# Patient Record
Sex: Female | Born: 1990 | Race: White | Hispanic: No | Marital: Single | Smoking: Never smoker
Health system: Southern US, Community
[De-identification: ages and names within clinical notes are randomized; demographics above are authoritative.]

## PROBLEM LIST (undated history)

## (undated) DIAGNOSIS — R1319 Other dysphagia: Secondary | ICD-10-CM

## (undated) DIAGNOSIS — K222 Esophageal obstruction: Secondary | ICD-10-CM

## (undated) DIAGNOSIS — K219 Gastro-esophageal reflux disease without esophagitis: Secondary | ICD-10-CM

## (undated) DIAGNOSIS — D802 Selective deficiency of immunoglobulin A [IgA]: Secondary | ICD-10-CM

## (undated) DIAGNOSIS — G43909 Migraine, unspecified, not intractable, without status migrainosus: Secondary | ICD-10-CM

## (undated) DIAGNOSIS — K589 Irritable bowel syndrome without diarrhea: Secondary | ICD-10-CM

## (undated) DIAGNOSIS — J329 Chronic sinusitis, unspecified: Secondary | ICD-10-CM

## (undated) DIAGNOSIS — T753XXA Motion sickness, initial encounter: Secondary | ICD-10-CM

## (undated) DIAGNOSIS — J45909 Unspecified asthma, uncomplicated: Secondary | ICD-10-CM

## (undated) DIAGNOSIS — K529 Noninfective gastroenteritis and colitis, unspecified: Secondary | ICD-10-CM

## (undated) DIAGNOSIS — T7840XA Allergy, unspecified, initial encounter: Secondary | ICD-10-CM

## (undated) DIAGNOSIS — R51 Headache: Secondary | ICD-10-CM

## (undated) DIAGNOSIS — R1310 Dysphagia, unspecified: Secondary | ICD-10-CM

## (undated) DIAGNOSIS — R519 Headache, unspecified: Secondary | ICD-10-CM

## (undated) HISTORY — DX: Noninfective gastroenteritis and colitis, unspecified: K52.9

## (undated) HISTORY — PX: OTHER SURGICAL HISTORY: SHX169

## (undated) HISTORY — DX: Gastro-esophageal reflux disease without esophagitis: K21.9

## (undated) HISTORY — DX: Other dysphagia: R13.19

## (undated) HISTORY — DX: Migraine, unspecified, not intractable, without status migrainosus: G43.909

## (undated) HISTORY — PX: OVARIAN CYST REMOVAL: SHX89

## (undated) HISTORY — DX: Esophageal obstruction: K22.2

## (undated) HISTORY — DX: Allergy, unspecified, initial encounter: T78.40XA

---
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
## 2014-03-05 ENCOUNTER — Ambulatory Visit: Payer: Self-pay

## 2014-03-05 LAB — RAPID STREP-A WITH REFLX: Micro Text Report: NEGATIVE

## 2014-03-08 ENCOUNTER — Emergency Department: Payer: Self-pay | Admitting: Emergency Medicine

## 2014-03-08 DIAGNOSIS — K529 Noninfective gastroenteritis and colitis, unspecified: Secondary | ICD-10-CM | POA: Insufficient documentation

## 2014-03-08 LAB — COMPREHENSIVE METABOLIC PANEL
ALBUMIN: 3.5 g/dL (ref 3.4–5.0)
ALK PHOS: 98 U/L
ANION GAP: 11 (ref 7–16)
BILIRUBIN TOTAL: 0.4 mg/dL (ref 0.2–1.0)
BUN: 10 mg/dL (ref 7–18)
CALCIUM: 9.1 mg/dL (ref 8.5–10.1)
CHLORIDE: 103 mmol/L (ref 98–107)
Co2: 23 mmol/L (ref 21–32)
Creatinine: 0.91 mg/dL (ref 0.60–1.30)
Glucose: 82 mg/dL (ref 65–99)
Osmolality: 272 (ref 275–301)
POTASSIUM: 3.8 mmol/L (ref 3.5–5.1)
SGOT(AST): 37 U/L (ref 15–37)
SGPT (ALT): 43 U/L
SODIUM: 137 mmol/L (ref 136–145)
Total Protein: 8.1 g/dL (ref 6.4–8.2)

## 2014-03-08 LAB — URINALYSIS, COMPLETE
BILIRUBIN, UR: NEGATIVE
Bacteria: NONE SEEN
Blood: NEGATIVE
Glucose,UR: NEGATIVE mg/dL (ref 0–75)
Leukocyte Esterase: NEGATIVE
NITRITE: NEGATIVE
Ph: 5 (ref 4.5–8.0)
RBC,UR: <1 /HPF (ref 0–5)
Specific Gravity: 1.024 (ref 1.003–1.030)
Squamous Epithelial: 1

## 2014-03-08 LAB — CBC WITH DIFFERENTIAL/PLATELET
Basophil #: 0 10*3/uL (ref 0.0–0.1)
Basophil %: 0.6 %
EOS ABS: 0.1 10*3/uL (ref 0.0–0.7)
EOS PCT: 0.7 %
HCT: 42.7 % (ref 35.0–47.0)
HGB: 14.2 g/dL (ref 12.0–16.0)
Lymphocyte #: 1.5 10*3/uL (ref 1.0–3.6)
Lymphocyte %: 20.1 %
MCH: 27.5 pg (ref 26.0–34.0)
MCHC: 33.2 g/dL (ref 32.0–36.0)
MCV: 83 fL (ref 80–100)
Monocyte #: 0.6 x10 3/mm (ref 0.2–0.9)
Monocyte %: 8.1 %
NEUTROS ABS: 5.3 10*3/uL (ref 1.4–6.5)
NEUTROS PCT: 70.5 %
Platelet: 279 10*3/uL (ref 150–440)
RBC: 5.15 10*6/uL (ref 3.80–5.20)
RDW: 12.6 % (ref 11.5–14.5)
WBC: 7.5 10*3/uL (ref 3.6–11.0)

## 2014-03-08 LAB — HCG, QUANTITATIVE, PREGNANCY: Beta Hcg, Quant.: <1 m[IU]/mL — ABNORMAL LOW

## 2014-03-08 LAB — BETA STREP CULTURE(ARMC)

## 2014-03-08 LAB — LIPASE, BLOOD: Lipase: 111 U/L (ref 73–393)

## 2014-03-24 ENCOUNTER — Ambulatory Visit: Payer: Self-pay | Admitting: Gastroenterology

## 2014-06-27 ENCOUNTER — Ambulatory Visit
Admit: 2014-06-27 | Disposition: A | Discharge status: Home / Self Care | Payer: Self-pay | Attending: Family Medicine | Admitting: Family Medicine

## 2014-06-28 ENCOUNTER — Ambulatory Visit: Admit: 2014-06-28 | Disposition: A | Discharge status: Home / Self Care | Payer: Self-pay | Admitting: Urgent Care

## 2014-06-28 LAB — COMPREHENSIVE METABOLIC PANEL
ALT: 14 U/L
AST: 20 U/L
Albumin: 3.9 g/dL
Alkaline Phosphatase: 69 U/L
Anion Gap: 8 (ref 7–16)
BUN: 11 mg/dL
Bilirubin,Total: 0.2 mg/dL — ABNORMAL LOW
CREATININE: 1 mg/dL
Calcium, Total: 9 mg/dL
Chloride: 104 mmol/L
Co2: 26 mmol/L
EGFR (Non-African Amer.): >60
GLUCOSE: 92 mg/dL
Potassium: 3.6 mmol/L
Sodium: 138 mmol/L
TOTAL PROTEIN: 7.6 g/dL

## 2014-06-28 LAB — CBC WITH DIFFERENTIAL/PLATELET
Basophil #: 0 10*3/uL (ref 0.0–0.1)
Basophil %: 0.4 %
EOS ABS: 0.1 10*3/uL (ref 0.0–0.7)
Eosinophil %: 1.3 %
HCT: 39.8 % (ref 35.0–47.0)
HGB: 13.2 g/dL (ref 12.0–16.0)
LYMPHS PCT: 17.2 %
Lymphocyte #: 1.6 10*3/uL (ref 1.0–3.6)
MCH: 27 pg (ref 26.0–34.0)
MCHC: 33.1 g/dL (ref 32.0–36.0)
MCV: 82 fL (ref 80–100)
MONO ABS: 0.6 x10 3/mm (ref 0.2–0.9)
Monocyte %: 6.7 %
NEUTROS ABS: 7.1 10*3/uL — AB (ref 1.4–6.5)
Neutrophil %: 74.4 %
Platelet: 252 10*3/uL (ref 150–440)
RBC: 4.88 10*6/uL (ref 3.80–5.20)
RDW: 13.1 % (ref 11.5–14.5)
WBC: 9.5 10*3/uL (ref 3.6–11.0)

## 2014-06-28 LAB — HCG, QUANTITATIVE, PREGNANCY: Beta Hcg, Quant.: <1 m[IU]/mL

## 2014-06-28 LAB — LIPASE, BLOOD: LIPASE: 28 U/L

## 2014-06-30 ENCOUNTER — Ambulatory Visit
Admit: 2014-06-30 | Disposition: A | Discharge status: Home / Self Care | Payer: Self-pay | Attending: Urgent Care | Admitting: Urgent Care

## 2014-08-10 ENCOUNTER — Ambulatory Visit
Admission: EM | Admit: 2014-08-10 | Discharge: 2014-08-10 | Disposition: A | Discharge status: Home / Self Care | Payer: 59 | Attending: Family Medicine | Admitting: Family Medicine

## 2014-08-10 DIAGNOSIS — H109 Unspecified conjunctivitis: Secondary | ICD-10-CM | POA: Diagnosis not present

## 2014-08-10 DIAGNOSIS — R05 Cough: Secondary | ICD-10-CM

## 2014-08-10 DIAGNOSIS — R0982 Postnasal drip: Secondary | ICD-10-CM

## 2014-08-10 DIAGNOSIS — R059 Cough, unspecified: Secondary | ICD-10-CM

## 2014-08-10 HISTORY — DX: Unspecified asthma, uncomplicated: J45.909

## 2014-08-10 HISTORY — DX: Irritable bowel syndrome, unspecified: K58.9

## 2014-08-10 HISTORY — DX: Chronic sinusitis, unspecified: J32.9

## 2014-08-10 HISTORY — DX: Selective deficiency of immunoglobulin a (iga): D80.2

## 2014-08-10 MED ORDER — ALBUTEROL SULFATE HFA 108 (90 BASE) MCG/ACT IN AERS: 1.0000 | INHALATION_SPRAY | Freq: Four times a day (QID) | RESPIRATORY_TRACT | Status: DC | PRN

## 2014-08-10 MED ORDER — BENZONATATE 100 MG PO CAPS: 100.0000 mg | ORAL_CAPSULE | Freq: Three times a day (TID) | ORAL | Status: DC | PRN

## 2014-08-10 MED ORDER — FLUTICASONE PROPIONATE 50 MCG/ACT NA SUSP: 2.0000 | Freq: Every day | NASAL | Status: DC

## 2014-08-10 MED ORDER — IPRATROPIUM-ALBUTEROL 0.5-2.5 (3) MG/3ML IN SOLN
3.0000 mL | Freq: Once | RESPIRATORY_TRACT | Status: AC
Start: 2014-08-10 — End: 2014-08-10
  Administered 2014-08-10: 3 mL via RESPIRATORY_TRACT

## 2014-08-10 MED ORDER — POLYMYXIN B-TRIMETHOPRIM 10000-0.1 UNIT/ML-% OP SOLN: 1.0000 [drp] | OPHTHALMIC | Status: DC

## 2014-08-10 NOTE — ED Notes (Signed)
Has had cough and nasal congestion x 2 weeks. Left eye itching yesterday and crusted this morning

## 2014-08-10 NOTE — ED Provider Notes (Signed)
CSN: [id number]     Arrival date & time 08/10/14  1509 History   First MD Initiated Contact with Patient 08/10/14 1555     Chief Complaint  Patient presents with  . Facial Pain  . Cough  . Conjunctivitis   (Consider location/radiation/quality/duration/timing/severity/associated sxs/prior Treatment) HPI  24 yo F with hx of seasonal allergies and occassional sinusitis. Has been symptommatic for a few weeks. Uses [facility]. 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 Has had post nasal drip and cough, with throat irritation. Mild temp at 99 or 99.5  Past Medical History  Diagnosis Date  . IBS (irritable bowel syndrome)   . Asthma   . Sinusitis   . IgA deficiency, selective    Past Surgical History  Procedure Laterality Date  . Ovarian cyst removal Right     2007   Family History  Problem Relation Age of Onset  . Cancer Father    History  Substance Use Topics  . Smoking status: Never Smoker   . Smokeless tobacco: Not on file  . Alcohol Use: No   OB History    No data available     Review of Systems  HENT: Positive for congestion, postnasal drip and sinus pressure.   Eyes: Positive for discharge, redness and itching. Negative for photophobia, pain and visual disturbance.  Respiratory: Positive for cough.   Cardiovascular: Negative.   Gastrointestinal: Negative.   Endocrine: Negative.   Genitourinary: Negative.   Musculoskeletal: Negative.   Skin: Negative.   Allergic/Immunologic: Positive for environmental allergies.  Neurological: Negative.   Hematological: Negative.   Psychiatric/Behavioral: Negative.   All other systems reviewed and are negative. Has IBS and treats as needed  Allergies  Biaxin and Cefzil  Home Medications   [name]   Medication Sig Start Date End Date Taking? Authorizing Provider  dexlansoprazole ([name]) 60 MG capsule Take 60 mg by mouth  daily.   Yes Historical Provider, MD  [name] Take 10 mg by mouth 4 (four) times daily -  before meals and at bedtime.   Yes Historical Provider, MD  [name] Take 180 mg by mouth daily.   Yes Historical Provider, MD  lubiprostone (AMITIZA) 8 MCG capsule Take 8 mcg by mouth 2 (two) times daily with a meal.   Yes Historical Provider, MD  norethindrone-ethinyl estradiol-iron (MICROGESTIN FE,GILDESS FE,LOESTRIN FE) 1.5-30 MG-MCG tablet Take 1 tablet by mouth daily.   Yes Historical Provider, MD  ondansetron (ZOFRAN) 4 MG tablet Take 4 mg by mouth every 8 (eight) hours as needed for nausea or vomiting.   Yes Historical Provider, MD  [name] Take 25 mg by mouth every 6 (six) hours as needed for nausea or vomiting.   Yes Historical Provider, MD  [name];[name] Inhale 1-2 puffs into the lungs every 6 (six) hours as needed for wheezing or shortness of breath. Or cough 08/10/14   Rae Halsted, PA-C  benzonatate (TESSALON) 100 MG capsule Take 1 capsule (100 mg total) by mouth 3 (three) times daily as needed for cough. 08/10/14   Rae Halsted, PA-C  fluticasone [facility]) 50 MCG/ACT nasal spray Place 2 sprays into both nostrils daily. 08/10/14   Rae Halsted, PA-C  [name] Take 5 mg by mouth 4 (four) times daily.    Historical Provider, MD  trimethoprim-polymyxin b (POLYTRIM) ophthalmic solution Place 1 drop into the left eye every 4 (four) hours. 08/10/14   Rae HalstedLaurie W Topanga Alvelo, PA-C   BP 118/68 mmHg  Pulse 100  Temp(Src) 99 F (37.2 C) (Tympanic)  Resp 16  Ht 5\' 5"  (1.651 m)  Wt 138 lb (62.596 kg)  BMI 22.96 kg/m2  SpO2 100%  LMP 08/09/2014 (Exact Date) Physical Exam Constitutional -alert and oriented,well appearing and in mild distress w cough and concern about her left eye Head-atraumatic Eyes- R conjunctiva normal, L mildly injected, increased  tearing, itchy irritation,no visual changes EOMI ,conjugate gaze Ears-canals clear bilat, TMs WNL Nose-mild congestion,has some sinus pressure Mouth/throat- mucous membranes moist ,oropharynx non-erythematous Neck- supple without glandular enlargement CV- regular rate, grossly normal heart sounds, Resp- normal respiratory effort,clear to auscultation bilaterally,episodic non-productive cough History of childhood asthma GI-,no distention GU- not examined MSK-  ambulatory Neuro- normal speech and language, Skin-warm,dry ,intact; no rash noted Psych- speech and behavior grossly normal  ED Course  Procedures (including critical care time) Labs Review Labs Reviewed - No data to display  Imaging Review  No results found.  Medications  ipratropium-albuterol (DUONEB) 0.5-2.5 (3) MG/3ML nebulizer solution 3 mL (3 mLs Nebulization Given 08/10/14 1618)   Patient had relief of cough post Neb Rx and was very pleased- has used inhaler in the past with childhood asthma-OK for prn as she is uncomfortable with a cough on duty.  MDM   1. Cough   2. Post-nasal drip   3. Conjunctivitis of left eye     Plan: 1.  Diagnosis reviewed with patient-seasonal allergies suspected- hx asthma 2. Rx Benzonatate, fluticasone ; risks, benefits, potential side effects reviewed with patient Add albuterol inhaler - patient has used in the past and is familiar. 3. Recommend supportive treatment with OTC ceterizine/guafenesin/tylenol/ibuprofen/fluids 4. Add Polytrim for left eye symptoms-careful handwashing 5. F/u prn if symptoms worsen or don't improve; viral syndrome can be similar- she does not tolerate decongestants and defers 6. She has sick time - OK for 2 days OOW- 7. Strongly encouraged to establish PCP care- given local business cards 8.Questions fielded , recommendations and expectations reviewed- patient expresses understanding   Rae HalstedLaurie W Gyan Cambre, PA-C 08/11/14 1707

## 2014-08-11 ENCOUNTER — Encounter: Payer: Self-pay | Admitting: Physician Assistant

## 2014-10-11 ENCOUNTER — Ambulatory Visit: Payer: Self-pay | Admitting: Urgent Care

## 2014-10-12 ENCOUNTER — Ambulatory Visit: Payer: Self-pay | Admitting: Gastroenterology

## 2014-10-13 ENCOUNTER — Encounter: Payer: Self-pay | Admitting: Obstetrics and Gynecology

## 2014-10-13 ENCOUNTER — Ambulatory Visit (INDEPENDENT_AMBULATORY_CARE_PROVIDER_SITE_OTHER): Payer: 59 | Admitting: Obstetrics and Gynecology

## 2014-10-13 VITALS — BP 98/68 | HR 87 | Ht 65.0 in | Wt 140.5 lb

## 2014-10-13 DIAGNOSIS — N921 Excessive and frequent menstruation with irregular cycle: Secondary | ICD-10-CM

## 2014-10-13 DIAGNOSIS — Z8742 Personal history of other diseases of the female genital tract: Secondary | ICD-10-CM

## 2014-10-13 DIAGNOSIS — Z01419 Encounter for gynecological examination (general) (routine) without abnormal findings: Secondary | ICD-10-CM

## 2014-10-13 DIAGNOSIS — Z23 Encounter for immunization: Secondary | ICD-10-CM

## 2014-10-13 MED ORDER — JUNEL FE 1/20 1-20 MG-MCG PO TABS: 1.0000 | ORAL_TABLET | Freq: Every day | ORAL | Status: DC

## 2014-10-13 NOTE — Patient Instructions (Addendum)
Place annual gynecologic exam patient instructions here.  Thank you for enrolling in [facility]. Please follow the instructions below to securely access your online medical record. MyChart allows you to send messages to your doctor, view your test results, manage appointments, and more.   How Do I Sign Up? 1. In your Internet browser, go to [employer] and enter [email or web address]. 2. Click on the Sign Up Now link in the Sign In box. You will see the New Member Sign Up page. 3. Enter your MyChart Access Code exactly as it appears below. You will not need to use this code after you've completed the sign-up process. If you do not sign up before the expiration date, you must request a new code.  MyChart Access Code: [email or web address] Expires: 12/12/2014 10:17 AM  4. Enter your Social Security Number ([email or web address]) and Date of Birth (mm/dd/yyyy) as indicated and click Submit. You will be taken to the next sign-up page. 5. Create a MyChart ID. This will be your MyChart login ID and cannot be changed, so think of one that is secure and easy to remember. 6. Create a MyChart password. You can change your password at any time. 7. Enter your Password Reset Question and Answer. This can be used at a later time if you forget your password.  8. Enter your e-mail address. You will receive e-mail notification when new information is available in [facility]. 9. Click Sign Up. You can now view your medical record.   Additional Information Remember, MyChart is NOT to be used for urgent needs. For medical emergencies, dial 911.   .Human Papillomavirus Quadrivalent Vaccine suspension for injection What is this medicine? HUMAN PAPILLOMAVIRUS VACCINE (HYOO muhn pap uh LOH muh vahy ruhs vak SEEN) is a vaccine. It is used to prevent infections of four types of the human papillomavirus. In women, the vaccine may lower your risk of getting cervical, vaginal, vulvar, or anal cancer and genital warts. In men,  the vaccine may lower your risk of getting genital warts and anal cancer. You cannot get these diseases from the vaccine. This vaccine does not treat these diseases. This medicine may be used for other purposes; ask your health care provider or pharmacist if you have questions. COMMON BRAND NAME(S): Gardasil What should I tell my health care provider before I take this medicine? They need to know if you have any of these conditions: -fever or infection -hemophilia -HIV infection or AIDS -immune system problems -low platelet count -an unusual reaction to Human Papillomavirus Vaccine, yeast, other medicines, foods, dyes, or preservatives -pregnant or trying to get pregnant -breast-feeding How should I use this medicine? This vaccine is for injection in a muscle on your upper arm or thigh. It is given by a health care professional. Dennis Bast will be observed for 15 minutes after each dose. Sometimes, fainting happens after the vaccine is given. You may be asked to sit or lie down during the 15 minutes. Three doses are given. The second dose is given 2 months after the first dose. The last dose is given 4 months after the second dose. A copy of a Vaccine Information Statement will be given before each vaccination. Read this sheet carefully each time. The sheet may change frequently. Talk to your pediatrician regarding the use of this medicine in children. While this drug may be prescribed for children as young as 10 years of age for selected conditions, precautions do apply. Overdosage: If you think you have taken too much of this medicine contact  a poison control center or emergency room at once. NOTE: This medicine is only for you. Do not share this medicine with others. What if I miss a dose? All 3 doses of the vaccine should be given within 6 months. Remember to keep appointments for follow-up doses. Your health care provider will tell you when to return for the next vaccine. Ask your health care  professional for advice if you are unable to keep an appointment or miss a scheduled dose. What may interact with this medicine? -other vaccines This list may not describe all possible interactions. Give your health care provider a list of all the medicines, herbs, non-prescription drugs, or dietary supplements you use. Also tell them if you smoke, drink alcohol, or use illegal drugs. Some items may interact with your medicine. What should I watch for while using this medicine? This vaccine may not fully protect everyone. Continue to have regular pelvic exams and cervical or anal cancer screenings as directed by your doctor. The Human Papillomavirus is a sexually transmitted disease. It can be passed by any kind of sexual activity that involves genital contact. The vaccine works best when given before you have any contact with the virus. Many people who have the virus do not have any signs or symptoms. Tell your doctor or health care professional if you have any reaction or unusual symptom after getting the vaccine. What side effects may I notice from receiving this medicine? Side effects that you should report to your doctor or health care professional as soon as possible: -allergic reactions like skin rash, itching or hives, swelling of the face, lips, or tongue -breathing problems -feeling faint or lightheaded, falls Side effects that usually do not require medical attention (report to your doctor or health care professional if they continue or are bothersome): -cough -dizziness -fever -headache -nausea -redness, warmth, swelling, pain, or itching at site where injected This list may not describe all possible side effects. Call your doctor for medical advice about side effects. You may report side effects to FDA at 1-800-FDA-1088. Where should I keep my medicine? This drug is given in a hospital or clinic and will not be stored at home. NOTE: This sheet is a summary. It may not cover all  possible information. If you have questions about this medicine, talk to your doctor, pharmacist, or health care provider.  2015, Elsevier/Gold Standard. (2013-04-26 13:14:33)

## 2014-10-13 NOTE — Progress Notes (Signed)
  Subjective:     Carol Conley is a 24 y.o. female and is here for a comprehensive physical exam. The patient reports problems - BTB on generic OCPs as the pharmacy keeps changing brand she is taking.  History   Social History  . [name]    [name]  . [name]  . [name]   Occupational History  . Not on file.   Social History Main Topics  . Smoking status: Never Smoker   . Smokeless tobacco: Not on file  . Alcohol Use: No  . [name]  . [name]   Other Topics Concern  . Not on file   Social History Narrative   Health Maintenance  Topic Date Due  . HIV Screening  04/16/2003  . PAP SMEAR  04/15/2006  . TETANUS/TDAP  04/16/2007  . INFLUENZA VACCINE  10/17/2014    The following portions of the patient's history were reviewed and updated as appropriate: allergies, current medications, past family history, past medical history, past social history, past surgical history and problem list.  Review of Systems A comprehensive review of systems was negative except for: Gastrointestinal: positive for abdominal pain, diarrhea and flares of IBS- not new Genitourinary: positive for frequency and BTB on curent OCPs (which she takes continuously)   Objective:    General appearance: alert, cooperative, appears stated age and no distress Neck: no adenopathy, no carotid bruit, no JVD, supple, symmetrical, trachea midline and thyroid not enlarged, symmetric, no tenderness/mass/nodules Lungs: clear to auscultation bilaterally Breasts: normal appearance, no masses or tenderness Heart: regular rate and rhythm, S1, S2 normal, no murmur, click, rub or gallop Abdomen: soft, non-tender; bowel sounds normal; no masses,  no organomegaly Pelvic: not indicated    Assessment:    Healthy female exam. BTB on current OCPs; IBS; start gardasil series      Plan:  Rx given for [facility] FE only- taking continuously 1st  gardasil given, RTC at 2 & 6 months RTC 1 year or prn   See After Visit Summary for Counseling Recommendations  Isabelle Matt Ines Bloomer, CNM

## 2014-10-20 ENCOUNTER — Encounter: Payer: Self-pay | Admitting: Gastroenterology

## 2014-10-20 ENCOUNTER — Other Ambulatory Visit: Payer: Self-pay

## 2014-10-20 ENCOUNTER — Ambulatory Visit (INDEPENDENT_AMBULATORY_CARE_PROVIDER_SITE_OTHER): Payer: 59 | Admitting: Gastroenterology

## 2014-10-20 VITALS — BP 110/65 | HR 96 | Temp 98.7°F | Ht 65.0 in | Wt 140.0 lb

## 2014-10-20 DIAGNOSIS — K649 Unspecified hemorrhoids: Secondary | ICD-10-CM | POA: Insufficient documentation

## 2014-10-20 DIAGNOSIS — R110 Nausea: Secondary | ICD-10-CM | POA: Diagnosis not present

## 2014-10-20 DIAGNOSIS — K589 Irritable bowel syndrome without diarrhea: Secondary | ICD-10-CM | POA: Insufficient documentation

## 2014-10-20 DIAGNOSIS — K648 Other hemorrhoids: Secondary | ICD-10-CM | POA: Insufficient documentation

## 2014-10-20 DIAGNOSIS — K219 Gastro-esophageal reflux disease without esophagitis: Secondary | ICD-10-CM | POA: Insufficient documentation

## 2014-10-20 DIAGNOSIS — D802 Selective deficiency of immunoglobulin A [IgA]: Secondary | ICD-10-CM | POA: Insufficient documentation

## 2014-10-20 DIAGNOSIS — Z8719 Personal history of other diseases of the digestive system: Secondary | ICD-10-CM | POA: Insufficient documentation

## 2014-10-20 DIAGNOSIS — J45909 Unspecified asthma, uncomplicated: Secondary | ICD-10-CM | POA: Insufficient documentation

## 2014-10-20 DIAGNOSIS — R1013 Epigastric pain: Secondary | ICD-10-CM | POA: Insufficient documentation

## 2014-10-20 DIAGNOSIS — R109 Unspecified abdominal pain: Secondary | ICD-10-CM | POA: Insufficient documentation

## 2014-10-20 MED ORDER — ONDANSETRON 4 MG PO TBDP
4.0000 mg | ORAL_TABLET | Freq: Three times a day (TID) | ORAL | Status: DC | PRN
Start: 2014-10-20 — End: 2015-11-24

## 2014-10-20 MED ORDER — DESIPRAMINE HCL 25 MG PO TABS: 25.0000 mg | ORAL_TABLET | Freq: Every day | ORAL | Status: DC

## 2014-10-20 NOTE — Progress Notes (Signed)
Primary Care Physician: No PCP Per Patient  Primary Gastroenterologist:  Dr. [name]  Chief Complaint  Patient presents with  . Irritable Bowel Syndrome    HPI: Carol Conley is a 24 y.o. female here for follow-up of irritable bowel syndrome. The patient has chronic nausea with her irritable bowel syndrome and also has alternating diarrhea and constipation. The patient states that she has been in her usual state of health but had ran out of the medication. The patient had tried probiotics but states that they made her worse.  Current Outpatient Prescriptions  Medication Sig Dispense Refill  . [name];[name] Inhale 1-2 puffs into the lungs every 6 (six) hours as needed for wheezing or shortness of breath. Or cough 1 Inhaler 0  . aspirin-acetaminophen-caffeine (EXCEDRIN MIGRAINE) 250-250-65 MG per tablet Take by mouth.    . dexlansoprazole ([name]) 60 MG capsule Take 60 mg by mouth daily.    Marland Kitchen [name] Take 10 mg by mouth 4 (four) times daily -  before meals and at bedtime.    . [name] Take 180 mg by mouth daily.    . fluticasone (FLONASE) 50 MCG/ACT nasal spray Place 2 sprays into both nostrils daily. 16 g 2  . [name] 1-20 MG-MCG tablet Take 1 tablet by mouth daily. 4 Package 4  . [name] Take 5 mg by mouth 4 (four) times daily.    . ondansetron (ZOFRAN) 4 MG tablet Take 4 mg by mouth every 8 (eight) hours as needed for nausea or vomiting.    . [name] Take 25 mg by mouth every 6 (six) hours as needed for nausea or vomiting.    . benzonatate (TESSALON) 100 MG capsule Take 1 capsule (100 mg total) by mouth 3 (three) times daily as needed for cough. (Patient not taking: Reported on 10/13/2014) 30 capsule 1  . desipramine (NORPRAMIN) 25 MG tablet Take 1 tablet (25 mg total) by mouth at bedtime. 30 tablet 3  .  docusate sodium (COLACE) 100 MG capsule Take by mouth.    . [name] Take 1 tablet (4 mg total) by mouth every 8 (eight) hours as needed for nausea or vomiting. 30 tablet 5  . sucralfate (CARAFATE) 1 G tablet Take by mouth.     No current facility-administered medications for this visit.    Allergies as of 10/20/2014 - Review Complete 10/20/2014  Allergen Reaction Noted  . [name] Hives 08/10/2014  . [name] Hives 08/10/2014    ROS:  General: Negative for anorexia, weight loss, fever, chills, fatigue, weakness. ENT: Negative for hoarseness, difficulty swallowing , nasal congestion. CV: Negative for chest pain, angina, palpitations, dyspnea on exertion, peripheral edema.  Respiratory: Negative for dyspnea at rest, dyspnea on exertion, cough, sputum, wheezing.  GI: See history of present illness. GU:  Negative for dysuria, hematuria, urinary incontinence, urinary frequency, nocturnal urination.  Endo: Negative for unusual weight change.    Physical Examination:   BP 110/65 mmHg  Pulse 96  Temp(Src) 98.7 F (37.1 C)  Ht  (1.651 m)  Wt 140 lb (63.504 kg)  BMI 23.30 kg/m2  General: Well-nourished, well-developed in no acute distress.  Eyes: No icterus. Conjunctivae pink. Mouth: Oropharyngeal mucosa moist and pink , no lesions erythema or exudate. Lungs: Clear to auscultation bilaterally. Non-labored. Heart: Regular rate and rhythm, no murmurs  rubs or gallops.  Abdomen: Bowel sounds are normal, nontender, nondistended, no hepatosplenomegaly or masses, no abdominal bruits or hernia , no rebound or guarding.   Extremities: No lower extremity edema. No clubbing or deformities. Neuro: Alert and oriented x 3.  Grossly intact. Skin: Warm and dry, no jaundice.   Psych: Alert and cooperative, normal mood and affect.  Labs:    Imaging Studies: No results found.  Assessment and Plan:   Carol Conley is  a 24 y.o. y/o female comes in today with continued symptoms her irritable bowel syndrome with alternating diarrhea and constipation. The patient will have her Zofran refilled and the patient will be started on a low dose of desipramine at 25 mg to be taken right before she goes to bed. Patient has been told the pathophysiology of irritable bowel syndrome and how the desipramine can help her. He states she is willing to try it and will follow up as needed.   Note: This dictation was prepared with Dragon dictation along with smaller phrase technology. Any transcriptional errors that result from this process are unintentional.

## 2014-10-28 ENCOUNTER — Telehealth: Payer: Self-pay

## 2014-10-28 ENCOUNTER — Emergency Department (HOSPITAL_COMMUNITY): Payer: 59

## 2014-10-28 ENCOUNTER — Encounter (HOSPITAL_COMMUNITY): Payer: Self-pay | Admitting: *Deleted

## 2014-10-28 ENCOUNTER — Emergency Department (HOSPITAL_COMMUNITY)
Admission: EM | Admit: 2014-10-28 | Discharge: 2014-10-29 | Disposition: A | Discharge status: Home / Self Care | Payer: 59 | Attending: Emergency Medicine | Admitting: Emergency Medicine

## 2014-10-28 DIAGNOSIS — Z3202 Encounter for pregnancy test, result negative: Secondary | ICD-10-CM | POA: Insufficient documentation

## 2014-10-28 DIAGNOSIS — R112 Nausea with vomiting, unspecified: Secondary | ICD-10-CM | POA: Insufficient documentation

## 2014-10-28 DIAGNOSIS — R1084 Generalized abdominal pain: Secondary | ICD-10-CM | POA: Diagnosis present

## 2014-10-28 DIAGNOSIS — Z7951 Long term (current) use of inhaled steroids: Secondary | ICD-10-CM | POA: Diagnosis not present

## 2014-10-28 DIAGNOSIS — J45909 Unspecified asthma, uncomplicated: Secondary | ICD-10-CM | POA: Diagnosis not present

## 2014-10-28 DIAGNOSIS — K5900 Constipation, unspecified: Secondary | ICD-10-CM | POA: Diagnosis not present

## 2014-10-28 DIAGNOSIS — Z862 Personal history of diseases of the blood and blood-forming organs and certain disorders involving the immune mechanism: Secondary | ICD-10-CM | POA: Diagnosis not present

## 2014-10-28 DIAGNOSIS — K219 Gastro-esophageal reflux disease without esophagitis: Secondary | ICD-10-CM | POA: Diagnosis not present

## 2014-10-28 DIAGNOSIS — Z79899 Other long term (current) drug therapy: Secondary | ICD-10-CM | POA: Insufficient documentation

## 2014-10-28 DIAGNOSIS — K589 Irritable bowel syndrome without diarrhea: Secondary | ICD-10-CM | POA: Insufficient documentation

## 2014-10-28 LAB — CBC WITH DIFFERENTIAL/PLATELET
Basophils Absolute: 0 K/uL (ref 0.0–0.1)
Basophils Relative: 0 % (ref 0–1)
Eosinophils Absolute: 0 K/uL (ref 0.0–0.7)
Eosinophils Relative: 0 % (ref 0–5)
HCT: 34.6 % — ABNORMAL LOW (ref 36.0–46.0)
Hemoglobin: 11.9 g/dL — ABNORMAL LOW (ref 12.0–15.0)
Lymphocytes Relative: 10 % — ABNORMAL LOW (ref 12–46)
Lymphs Abs: 1.1 K/uL (ref 0.7–4.0)
MCH: 27.7 pg (ref 26.0–34.0)
MCHC: 34.4 g/dL (ref 30.0–36.0)
MCV: 80.7 fL (ref 78.0–100.0)
Monocytes Absolute: 0.7 K/uL (ref 0.1–1.0)
Monocytes Relative: 6 % (ref 3–12)
Neutro Abs: 9.6 K/uL — ABNORMAL HIGH (ref 1.7–7.7)
Neutrophils Relative %: 84 % — ABNORMAL HIGH (ref 43–77)
Platelets: 270 K/uL (ref 150–400)
RBC: 4.29 MIL/uL (ref 3.87–5.11)
RDW: 12.7 % (ref 11.5–15.5)
WBC: 11.5 K/uL — ABNORMAL HIGH (ref 4.0–10.5)

## 2014-10-28 LAB — URINALYSIS, ROUTINE W REFLEX MICROSCOPIC
Bilirubin Urine: NEGATIVE
Glucose, UA: NEGATIVE mg/dL
Hgb urine dipstick: NEGATIVE
Ketones, ur: NEGATIVE mg/dL
Leukocytes, UA: NEGATIVE
Nitrite: NEGATIVE
Protein, ur: NEGATIVE mg/dL
Specific Gravity, Urine: 1.018 (ref 1.005–1.030)
Urobilinogen, UA: 0.2 mg/dL (ref 0.0–1.0)
pH: 5.5 (ref 5.0–8.0)

## 2014-10-28 LAB — COMPREHENSIVE METABOLIC PANEL
ALBUMIN: 3.3 g/dL — AB (ref 3.5–5.0)
ALT: 11 U/L — ABNORMAL LOW (ref 14–54)
AST: 17 U/L (ref 15–41)
Alkaline Phosphatase: 67 U/L (ref 38–126)
Anion gap: 7 (ref 5–15)
BUN: 9 mg/dL (ref 6–20)
CALCIUM: 8.2 mg/dL — AB (ref 8.9–10.3)
CHLORIDE: 108 mmol/L (ref 101–111)
CO2: 23 mmol/L (ref 22–32)
CREATININE: 0.91 mg/dL (ref 0.44–1.00)
GFR calc non Af Amer: >60 mL/min (ref >60)
Glucose, Bld: 99 mg/dL (ref 65–99)
POTASSIUM: 3.4 mmol/L — AB (ref 3.5–5.1)
Sodium: 138 mmol/L (ref 135–145)
Total Bilirubin: 0.8 mg/dL (ref 0.3–1.2)
Total Protein: 6.1 g/dL — ABNORMAL LOW (ref 6.5–8.1)

## 2014-10-28 LAB — POC URINE PREG, ED: Preg Test, Ur: NEGATIVE

## 2014-10-28 LAB — LIPASE, BLOOD: Lipase: 17 U/L — ABNORMAL LOW (ref 22–51)

## 2014-10-28 MED ORDER — IOHEXOL 300 MG/ML  SOLN
25.0000 mL | Freq: Once | INTRAMUSCULAR | Status: AC | PRN
  Administered 2014-10-28: 25 mL via ORAL

## 2014-10-28 MED ORDER — IOHEXOL 300 MG/ML  SOLN
80.0000 mL | Freq: Once | INTRAMUSCULAR | Status: AC | PRN
  Administered 2014-10-28: 100 mL via INTRAVENOUS

## 2014-10-28 MED ORDER — HYDROMORPHONE HCL 1 MG/ML IJ SOLN
0.5000 mg | Freq: Once | INTRAMUSCULAR | Status: AC
  Administered 2014-10-28: 0.5 mg via INTRAVENOUS
  Filled 2014-10-28: qty 1

## 2014-10-28 MED ORDER — HYDROMORPHONE HCL 1 MG/ML IJ SOLN
1.0000 mg | Freq: Once | INTRAMUSCULAR | Status: DC
Start: 2014-10-28 — End: 2014-10-29
  Filled 2014-10-28: qty 1

## 2014-10-28 MED ORDER — FLEET ENEMA 7-19 GM/118ML RE ENEM
1.0000 | ENEMA | Freq: Once | RECTAL | Status: DC
  Filled 2014-10-28: qty 1

## 2014-10-28 MED ORDER — KETOROLAC TROMETHAMINE 30 MG/ML IJ SOLN
30.0000 mg | Freq: Once | INTRAMUSCULAR | Status: AC
  Administered 2014-10-28: 30 mg via INTRAVENOUS
  Filled 2014-10-28: qty 1

## 2014-10-28 MED ORDER — ONDANSETRON HCL 4 MG/2ML IJ SOLN
4.0000 mg | Freq: Once | INTRAMUSCULAR | Status: AC
  Administered 2014-10-28: 4 mg via INTRAVENOUS
  Filled 2014-10-28: qty 2

## 2014-10-28 MED ORDER — SODIUM CHLORIDE 0.9 % IV BOLUS (SEPSIS)
1000.0000 mL | Freq: Once | INTRAVENOUS | Status: AC
  Administered 2014-10-28: 1000 mL via INTRAVENOUS

## 2014-10-28 NOTE — ED Notes (Signed)
Patient with reported hx of constipation for 6 days.  She has hx of impaction.  She is having abd pain and n/v.  She has hx of ibs, new medication started desipramine  on Sunday and she noticed that her sx started.  Patient states she does not usually get this bad.  She called her MD who advised her to come to ED.  She is complaining of rectal pain as well.  Patient took zofran this morning at 0700.  She is alert.

## 2014-10-28 NOTE — Telephone Encounter (Signed)
Patient called at this time and states that she has stopped taking the Desipramine due to severe constipation as this has made her constipation much worse.   She explains that she is having severe abdominal cramping 10/10, cannot hold anything down including water, is severely impacted and cannot digitally remove any further stool as it is now too high.   She has tried 3 enemas OTC and 4 doses of Dulcolax  each.   Explained to patient that only other option would be to seen in the ER especially with severe abdominal pain and inability to keep anything down.  Patient verbalizes understanding of this.

## 2014-10-28 NOTE — ED Provider Notes (Signed)
CSN: [id number]     Arrival date & time 10/28/14  1539 History   First MD Initiated Contact with Patient 10/28/14 1557     Chief Complaint  Patient presents with  . Abdominal Pain  . Nausea  . Emesis  . Constipation     (Consider location/radiation/quality/duration/timing/severity/associated sxs/prior Treatment) Patient is a 24 y.o. female presenting with abdominal pain, vomiting, and constipation.  Abdominal Pain Pain location:  Generalized Pain quality: cramping and sharp   Pain radiates to:  Does not radiate Pain severity:  Severe Onset quality:  Gradual Duration:  1 week Timing:  Intermittent Progression:  Worsening Chronicity:  Recurrent Context comment:  Ho IBS, started on desipramine Relieved by:  Nothing Worsened by:  Nothing tried Ineffective treatments: digital disimpaction. Associated symptoms: constipation (1 week ago, maybe longer)), nausea and vomiting   Associated symptoms: no dysuria and no fever   Emesis Associated symptoms: abdominal pain   Constipation Associated symptoms: abdominal pain, nausea and vomiting   Associated symptoms: no dysuria and no fever     Past Medical History  Diagnosis Date  . IBS (irritable bowel syndrome)   . Asthma   . Sinusitis   . IgA deficiency, selective   . Colitis   . GERD (gastroesophageal reflux disease)    Past Surgical History  Procedure Laterality Date  . Ovarian cyst removal Right     2007   Family History  Problem Relation Age of Onset  . Cancer Father    Social History  Substance Use Topics  . Smoking status: Never Smoker   . Smokeless tobacco: Never Used  . Alcohol Use: No   OB History    Gravida Para Term Preterm AB TAB SAB Ectopic Multiple Living       Review of Systems  Constitutional: Negative for fever.  Gastrointestinal: Positive for nausea, vomiting, abdominal pain and constipation (1 week ago, maybe longer)).  Genitourinary: Negative for dysuria.  All other  systems reviewed and are negative.     Allergies  Biaxin and Cefzil  Home Medications   [name]   Medication Sig Start Date End Date Taking? Authorizing Provider  [name];[name] Inhale 1-2 puffs into the lungs every 6 (six) hours as needed for wheezing or shortness of breath. Or cough 08/10/14  Yes Rae Halsted, PA-C  aspirin-acetaminophen-caffeine (EXCEDRIN MIGRAINE) [phone] MG per tablet Take 2 tablets by mouth every 8 (eight) hours as needed for migraine.    Yes Historical Provider, MD  desipramine (NORPRAMIN) 25 MG tablet Take 1 tablet (25 mg total) by mouth at bedtime. 10/20/14  Yes [name], MD  dexlansoprazole ([name]) 60 MG capsule Take 60 mg by mouth daily.   Yes Historical Provider, MD  [name] Take 10 mg by mouth 4 (four) times daily -  before meals and at bedtime.   Yes Historical Provider, MD  docusate sodium (COLACE) 100 MG capsule Take by mouth.   Yes Historical Provider, MD  [name] Take 180 mg by mouth daily.   Yes Historical Provider, MD  fluticasone (FLONASE) 50 MCG/ACT nasal spray Place 2 sprays into both nostrils daily. 08/10/14  Yes Rae Halsted, PA-C  [name] 1-20 MG-MCG tablet Take 1 tablet by mouth daily. 10/13/14  Yes Melody Elissa Lovett, CNM  [name] Take 5 mg by mouth 4 (four)  times daily.   Yes Historical Provider, MD  [name] Take 1 tablet (4 mg total) by mouth every 8 (eight) hours as needed for nausea or vomiting. 10/20/14  Yes [name], MD  [name] Take 25 mg by mouth every 6 (six) hours as needed for nausea or vomiting.   Yes Historical Provider, MD   BP 100/64 mmHg  Pulse 97  Temp(Src) 98.3 F (36.8 C) (Oral)  Resp 22  Ht 5\' 5"  (1.651 m)  Wt 140 lb (63.504 kg)  BMI 23.30 kg/m2  SpO2 97% Physical Exam   Constitutional: She is oriented to person, place, and time. She appears well-developed and well-nourished.  HENT:  Head: Normocephalic and atraumatic.  Right Ear: External ear normal.  Left Ear: External ear normal.  Eyes: Conjunctivae and EOM are normal. Pupils are equal, round, and reactive to light.  Neck: Normal range of motion. Neck supple.  Cardiovascular: Normal rate, regular rhythm, normal heart sounds and intact distal pulses.   Pulmonary/Chest: Effort normal and breath sounds normal.  Abdominal: Soft. Bowel sounds are normal. There is generalized tenderness.  Musculoskeletal: Normal range of motion.  Neurological: She is alert and oriented to person, place, and time.  Skin: Skin is warm and dry.  Vitals reviewed.   ED Course  Fecal disimpaction Date/Time: 10/31/2014 3:14 PM Performed by: Mirian Mo Authorized by: Mirian Mo Consent: Verbal consent obtained. Local anesthesia used: no Patient sedated: no Patient tolerance: Patient tolerated the procedure well with no immediate complications   (including critical care time) Labs Review Labs Reviewed  COMPREHENSIVE METABOLIC PANEL - Abnormal; Notable for the following:    Potassium 3.4 (*)    Calcium 8.2 (*)    Total Protein 6.1 (*)    Albumin 3.3 (*)    ALT 11 (*)    All other components within normal limits  LIPASE, BLOOD - Abnormal; Notable for the following:    Lipase 17 (*)    All other components within normal limits  CBC WITH DIFFERENTIAL/PLATELET - Abnormal; Notable for the following:    WBC 11.5 (*)    Hemoglobin 11.9 (*)    HCT 34.6 (*)    Neutrophils Relative % 84 (*)    Neutro Abs 9.6 (*)    Lymphocytes Relative 10 (*)    All other components within normal limits  URINALYSIS, ROUTINE W REFLEX MICROSCOPIC (NOT AT [facility])  POC URINE PREG, ED    Imaging Review No results found. IMirian Mo, personally reviewed and evaluated these images and lab results as part of my medical  decision-making.   EKG Interpretation None      MDM   Final diagnoses:  Generalized abdominal pain   24 y.o. female with pertinent PMH of IBS, asthma presents with recurrent abdominal pain constipation as above. Patient attempted to disimpact herself night prior to visit which was unsuccessful. Mild nausea, no vomiting On arrival today vitals signs and physical exam as above. She had a fecal impaction on exam which was cleared manually by myself. Given an enema afterwards. CT scan obtained due to focal tenderness in the left lower quadrant and unremarkable. Discharged home in stable condition to follow-up with GI and strict return precautions for consultation given.    I have reviewed all laboratory and imaging studies if ordered as above  1. Generalized abdominal pain         Mirian Mo, MD 10/31/14 [phone]

## 2014-10-28 NOTE — ED Notes (Signed)
CT notified pt done with contrast. 

## 2014-10-28 NOTE — ED Notes (Signed)
MD at bedside attempting to disimpact pt.  Pt tolerating moderately well.

## 2014-10-29 NOTE — Discharge Instructions (Signed)
Abdominal Pain Many things can cause abdominal pain. Usually, abdominal pain is not caused by a disease and will improve without treatment. It can often be observed and treated at home. Your health care provider will do a physical exam and possibly order blood tests and X-rays to help determine the seriousness of your pain. However, in many cases, more time must pass before a clear cause of the pain can be found. Before that point, your health care provider may not know if you need more testing or further treatment. HOME CARE INSTRUCTIONS  Monitor your abdominal pain for any changes. The following actions may help to alleviate any discomfort you are experiencing:  Only take over-the-counter or prescription medicines as directed by your health care provider.  Do not take laxatives unless directed to do so by your health care provider.  Try a clear liquid diet (broth, tea, or water) as directed by your health care provider. Slowly move to a bland diet as tolerated. SEEK MEDICAL CARE IF:  You have unexplained abdominal pain.  You have abdominal pain associated with nausea or diarrhea.  You have pain when you urinate or have a bowel movement.  You experience abdominal pain that wakes you in the night.  You have abdominal pain that is worsened or improved by eating food.  You have abdominal pain that is worsened with eating fatty foods.  You have a fever. SEEK IMMEDIATE MEDICAL CARE IF:   Your pain does not go away within 2 hours.  You keep throwing up (vomiting).  Your pain is felt only in portions of the abdomen, such as the right side or the left lower portion of the abdomen.  You pass bloody or black tarry stools. MAKE SURE YOU:  Understand these instructions.   Will watch your condition.   Will get help right away if you are not doing well or get worse.  Document Released: 12/12/2004 Document Revised: 03/09/2013 Document Reviewed: 11/11/2012 [facility] Patient Information  2015 [place], [place]. This information is not intended to replace advice given to you by your health care provider. Make sure you discuss any questions you have with your health care provider. Fecal Impaction A fecal impaction happens when there is a large, firm amount of stool (or feces) that cannot be passed. The impacted stool is usually in the rectum, which is the lowest part of the large bowel. The impacted stool can block the colon and cause significant problems. CAUSES  The longer stool stays in the rectum, the harder it gets. Anything that slows down your bowel movements can lead to fecal impaction, such as:  Constipation. This can be a long-standing (chronic) problem or can happen suddenly (acute).  Painful conditions of the rectum, such as hemorrhoids or anal fissures. The pain of these conditions can make you try to avoid having bowel movements.  Narcotic pain-relieving medicines, such as methadone, morphine, or codeine.  Not drinking enough fluids.  Inactivity and bed rest over long periods of time.  Diseases of the brain or nervous system that damage the nerves controlling the muscles of the intestines. SIGNS AND SYMPTOMS   Lack of normal bowel movements or changes in bowel patterns.  Sense of fullness in the rectum but unable to pass stool.  Pain or cramps in the abdominal area (often after meals).  Thin, watery discharge from the rectum. DIAGNOSIS  Your health care provider may suspect that you have a fecal impaction based on your symptoms and a physical exam. This will include an  exam of your rectum. Sometimes X-rays or lab testing may be needed to confirm the diagnosis and to be sure there are no other problems.  TREATMENT   Initially an impaction can be removed manually. Using a gloved finger, your health care provider can remove hard stool from your rectum.  Medicine is sometimes needed. A suppository or enema can be given in the rectum to soften the stool, which can  stimulate a bowel movement. Medicines can also be given by mouth (orally).  Though rare, surgery may be needed if the colon has torn (perforated) due to blockage. HOME CARE INSTRUCTIONS   Develop regular bowel habits. This could include getting in the habit of having a bowel movement after your morning cup of coffee or after eating. Be sure to allow yourself enough time on the toilet.  Maintain a high-fiber diet.  Drink enough fluids to keep your urine clear or pale yellow as directed by your health care provider.  Exercise regularly.  If you begin to get constipated, increase the amount of fiber in your diet. Eat plenty of fruits, vegetables, whole wheat breads, bran, oatmeal, and similar products.  Take natural fiber laxatives or other laxatives only as directed by your health care provider. SEEK MEDICAL CARE IF:   You have ongoing rectal pain.  You require enemas or suppositories more than twice a week.  You have rectal bleeding.  You have continued problems, or you develop abdominal pain.  You have thin, pencil-like stools. SEEK IMMEDIATE MEDICAL CARE IF:  You have black or tarry stools. MAKE SURE YOU:   Understand these instructions.  Will watch your condition.  Will get help right away if you are not doing well or get worse. Document Released: 11/25/2003 Document Revised: 12/23/2012 Document Reviewed: 09/08/2012 [facility] Patient Information 2015 [place], [place]. This information is not intended to replace advice given to you by your health care provider. Make sure you discuss any questions you have with your health care provider.

## 2014-10-31 ENCOUNTER — Telehealth: Payer: Self-pay | Admitting: Surgery

## 2014-10-31 NOTE — Telephone Encounter (Signed)
Pt called today stating she was seen in the ER over the weekend and was severely constipation. Was told to follow up with Dr. Servando Snare upon discharge.  She was given a enema during the ER visit but stated it helped some but still feels a lot abdominal pain and nausea. She stated she just doesn't feel cleaned out. Appt was scheduled with Dr. Servando Snare next week, but advised that laxatives are only for short term use. OTC miralax and stools softeners would be a better option. Also advised her to stop any milk products as these would constipate her too. Due to Dr. Servando Snare being out of the office, advised her if symptoms worsen then go to the ER.

## 2014-10-31 NOTE — Telephone Encounter (Signed)
Patient was seen in the ED Friday and said she still wasn't feeling well. She would like someone to call her please

## 2014-11-09 ENCOUNTER — Encounter: Payer: Self-pay | Admitting: Gastroenterology

## 2014-11-09 ENCOUNTER — Ambulatory Visit (INDEPENDENT_AMBULATORY_CARE_PROVIDER_SITE_OTHER): Payer: 59 | Admitting: Gastroenterology

## 2014-11-09 VITALS — BP 105/74 | HR 86 | Temp 98.4°F | Ht 65.0 in | Wt 138.0 lb

## 2014-11-09 DIAGNOSIS — R110 Nausea: Secondary | ICD-10-CM | POA: Diagnosis not present

## 2014-11-09 DIAGNOSIS — K589 Irritable bowel syndrome without diarrhea: Secondary | ICD-10-CM | POA: Diagnosis not present

## 2014-11-09 MED ORDER — HYDROCORTISONE ACETATE 25 MG RE SUPP: 25.0000 mg | Freq: Two times a day (BID) | RECTAL | Status: DC

## 2014-11-09 MED ORDER — PROMETHAZINE HCL 25 MG PO TABS: 25.0000 mg | ORAL_TABLET | Freq: Four times a day (QID) | ORAL | Status: DC | PRN

## 2014-11-09 NOTE — Progress Notes (Signed)
Primary Care Physician: No PMidge Miniumatient  Primary Gastroenterologist:  Dr. Rosine Solecki  Chief Complaint  Patient presents with  . Constipation    HPI: Carol Conley is a 24 y.o. female here for follow-up after being in the emergency room with severe constipation. The patient reports that she tried to desipramine I had given her and it made her more constipated. The patient became very constipated with an ability to manually disimpact herself so she went to the emergency Department for severe constipation and abdominal pain. The patient was given laxatives and was disimpact it in the emergency room. After taking a laxatives she reports that her still has been soft and she has had no further constipation. She also reports that she has continued problems with her hemorrhoids from her constipation and continues to have nausea.   Current Outpatient Prescriptions  Medication Sig Dispense Refill  . [name];[name] Inhale 1-2 puffs into the lungs every 6 (six) hours as needed for wheezing or shortness of breath. Or cough 1 Inhaler 0  . amoxicillin-clavulanate (AUGMENTIN) 875-125 MG per tablet Take 1 tablet by mouth 2 (two) times daily.    Marland Kitchen aspirin-acetaminophen-caffeine (EXCEDRIN MIGRAINE) 250-250-65 MG per tablet Take 2 tablets by mouth every 8 (eight) hours as needed for migraine.     Marland Kitchen dexlansoprazole ([name]) 60 MG capsule Take 60 mg by mouth daily.    Marland Kitchen [name] Take 10 mg by mouth 4 (four) times daily -  before meals and at bedtime.    . docusate sodium (COLACE) 100 MG capsule Take by mouth.    . [name] Take 180 mg by mouth daily.    . fluticasone (FLONASE) 50 MCG/ACT nasal spray Place 2 sprays into both nostrils daily. 16 g 2  . [name] Place 1 suppository (25 mg total) rectally 2 (two) times daily. 14 suppository 2  . [name] 1-20  MG-MCG tablet Take 1 tablet by mouth daily. 4 Package 4  . [name] Take 5 mg by mouth 4 (four) times daily.    . [name] Take 1 tablet (4 mg total) by mouth every 8 (eight) hours as needed for nausea or vomiting. 30 tablet 5  . [name] Take 1 tablet (25 mg total) by mouth every 6 (six) hours as needed for nausea or vomiting. 30 tablet 6  . desipramine (NORPRAMIN) 25 MG tablet Take 1 tablet (25 mg total) by mouth at bedtime. (Patient not taking: Reported on 11/09/2014) 30 tablet 3   No current facility-administered medications for this visit.    Allergies as of 11/09/2014 - Review Complete 11/09/2014  Allergen Reaction Noted  . [name] Hives 08/10/2014  . [name] Hives 08/10/2014    ROS:  General: Negative for anorexia, weight loss, fever, chills, fatigue, weakness. ENT: Negative for hoarseness, difficulty swallowing , nasal congestion. CV: Negative for chest pain, angina, palpitations, dyspnea on exertion, peripheral edema.  Respiratory: Negative for dyspnea at rest, dyspnea on exertion, cough, sputum, wheezing.  GI: See history of present illness. GU:  Negative for dysuria, hematuria, urinary incontinence, urinary frequency, nocturnal urination.  Endo: Negative for unusual weight change.    Physical Examination:   BP 105/74 mmHg  Pulse 86  Temp(Src) 98.4 F (36.9 C) (Oral)  Ht  (1.651 m)  Wt 138 lb (62.596 kg)  BMI 22.96 kg/m2  General: Well-nourished, well-developed in no acute distress.  Eyes: No icterus. Conjunctivae pink. Mouth: Oropharyngeal mucosa moist and pink , no lesions erythema or exudate. Lungs: Clear to auscultation bilaterally. Non-labored. Heart: Regular rate and rhythm, no murmurs rubs or gallops.  Abdomen: Bowel sounds are normal, nontender, nondistended, no hepatosplenomegaly or masses, no abdominal bruits or hernia , no  rebound or guarding.   Extremities: No lower extremity edema. No clubbing or deformities. Neuro: Alert and oriented x 3.  Grossly intact. Skin: Warm and dry, no jaundice.   Psych: Alert and cooperative, normal mood and affect.  Labs:    Imaging Studies: Ct Abdomen Pelvis W Contrast  10/28/2014   CLINICAL DATA:  Constipation x6 days, abdominal pain, nausea/vomiting. History of IBS.  EXAM: CT ABDOMEN AND PELVIS WITH CONTRAST  TECHNIQUE: Multidetector CT imaging of the abdomen and pelvis was performed using the standard protocol following bolus administration of intravenous contrast.  CONTRAST:  OMNIPAQUE IOHEXOL 300 MG/ML  SOLN  COMPARISON:  None.  FINDINGS: Lower chest:  Lung bases are clear.  Hepatobiliary: Liver is within normal limits. No suspicious/enhancing hepatic lesions.  Gallbladder is unremarkable. No intrahepatic or extrahepatic ductal dilatation.  Pancreas: Within normal limits.  Spleen: Within normal limits.  Adrenals/Urinary Tract: Adrenal glands are within normal limits.  Kidneys are within normal limits.  No hydronephrosis.  Bladder is within normal limits.  Stomach/Bowel: Stomach is within normal limits.  No evidence of bowel obstruction.  Normal appendix.  Moderate colonic stool burden, suggesting constipation, with moderate fecal impaction in the distal sigmoid/rectum (series 2/image 67).  Vascular/Lymphatic: No evidence of abdominal aortic aneurysm.  No suspicious abdominopelvic lymphadenopathy.  Reproductive: Uterus is within normal limits.  Bilateral ovaries are within normal limits.  Other: No abdominopelvic ascites.  Musculoskeletal: Visualized osseous structures are within normal limits.  IMPRESSION: Constipation with moderate fecal impaction in the distal sigmoid/rectum.   Electronically Signed   By: Charline Bills M.D.   On: 10/28/2014 21:49    Assessment and Plan:   Carol Conley is a 24 y.o. y/o female  with a history of irritable bowel syndrome with  alternating diarrhea and constipation. The patient's most recent problem was constipation with fecal impaction in the distal sigmoid colon. The patient has been told to titrate Miralax for soft stools. She is also been told to start Citracil  and stop her fiber gummy's. She has been told that if she starts to become constipated she can titrate the Miralax to a larger dose. She will also have her prescriptions for her nausea medication refill.   Note: This dictation was prepared with Dragon dictation along with smaller phrase technology. Any transcriptional errors that result from this process are unintentional.

## 2014-12-14 ENCOUNTER — Ambulatory Visit (INDEPENDENT_AMBULATORY_CARE_PROVIDER_SITE_OTHER): Payer: 59

## 2014-12-14 VITALS — BP 95/68 | HR 96 | Ht 65.0 in | Wt 139.9 lb

## 2014-12-14 DIAGNOSIS — Z23 Encounter for immunization: Secondary | ICD-10-CM | POA: Diagnosis not present

## 2014-12-14 MED ORDER — HPV QUADRIVALENT VACCINE IM SUSP
0.5000 mL | Freq: Once | INTRAMUSCULAR | Status: AC
  Administered 2014-12-14: 0.5 mL via INTRAMUSCULAR

## 2014-12-14 NOTE — Progress Notes (Signed)
Patient ID: Carol Conley, female   DOB: 10-22-1990, 24 y.o.   MRN: [id number]   Pt presents for 2nd gardasil inj. NO s/e from 1st inj.

## 2015-03-07 ENCOUNTER — Emergency Department (HOSPITAL_COMMUNITY)
Admission: EM | Admit: 2015-03-07 | Discharge: 2015-03-07 | Disposition: A | Discharge status: Home / Self Care | Payer: 59 | Attending: Emergency Medicine | Admitting: Emergency Medicine

## 2015-03-07 ENCOUNTER — Emergency Department (HOSPITAL_COMMUNITY): Payer: 59

## 2015-03-07 ENCOUNTER — Encounter (HOSPITAL_COMMUNITY): Payer: Self-pay | Admitting: Emergency Medicine

## 2015-03-07 DIAGNOSIS — Z7951 Long term (current) use of inhaled steroids: Secondary | ICD-10-CM | POA: Diagnosis not present

## 2015-03-07 DIAGNOSIS — K219 Gastro-esophageal reflux disease without esophagitis: Secondary | ICD-10-CM | POA: Insufficient documentation

## 2015-03-07 DIAGNOSIS — Z7982 Long term (current) use of aspirin: Secondary | ICD-10-CM | POA: Diagnosis not present

## 2015-03-07 DIAGNOSIS — Z79899 Other long term (current) drug therapy: Secondary | ICD-10-CM | POA: Insufficient documentation

## 2015-03-07 DIAGNOSIS — K649 Unspecified hemorrhoids: Secondary | ICD-10-CM | POA: Insufficient documentation

## 2015-03-07 DIAGNOSIS — Z793 Long term (current) use of hormonal contraceptives: Secondary | ICD-10-CM | POA: Insufficient documentation

## 2015-03-07 DIAGNOSIS — Z7952 Long term (current) use of systemic steroids: Secondary | ICD-10-CM | POA: Diagnosis not present

## 2015-03-07 DIAGNOSIS — K601 Chronic anal fissure: Secondary | ICD-10-CM | POA: Insufficient documentation

## 2015-03-07 DIAGNOSIS — K5900 Constipation, unspecified: Secondary | ICD-10-CM | POA: Diagnosis present

## 2015-03-07 DIAGNOSIS — Z862 Personal history of diseases of the blood and blood-forming organs and certain disorders involving the immune mechanism: Secondary | ICD-10-CM | POA: Diagnosis not present

## 2015-03-07 DIAGNOSIS — J45909 Unspecified asthma, uncomplicated: Secondary | ICD-10-CM | POA: Diagnosis not present

## 2015-03-07 MED ORDER — LIDOCAINE VISCOUS 2 % MT SOLN: 15.0000 mL | Freq: Once | OROMUCOSAL | Status: DC

## 2015-03-07 MED ORDER — LIDOCAINE HCL 2 % EX GEL
1.0000 "application " | Freq: Once | CUTANEOUS | Status: AC
  Administered 2015-03-07: 1
  Filled 2015-03-07: qty 20

## 2015-03-07 MED ORDER — LIDOCAINE (ANORECTAL) 5 % EX CREA: TOPICAL_CREAM | CUTANEOUS | Status: DC

## 2015-03-07 MED ORDER — PHENYLEPH-SHARK LIV OIL-MO-PET 0.25-3-14-71.9 % RE OINT: 1.0000 "application " | TOPICAL_OINTMENT | Freq: Two times a day (BID) | RECTAL | Status: DC | PRN

## 2015-03-07 MED ORDER — PROMETHAZINE HCL 25 MG PO TABS
25.0000 mg | ORAL_TABLET | ORAL | Status: AC
  Administered 2015-03-07: 25 mg via ORAL
  Filled 2015-03-07: qty 1

## 2015-03-07 NOTE — ED Notes (Signed)
Supplies set up at bedside.  Patient requesting to administer enema independently in bathroom.  Enema mixed and primed with patient and instructions provided.  Pt ambulated independently to restroom and is performing enema at this time.

## 2015-03-07 NOTE — ED Notes (Signed)
Pt able to dress and ambulate independently 

## 2015-03-07 NOTE — Discharge Instructions (Signed)
Constipation, Adult Constipation is when a person has fewer than three bowel movements a week, has difficulty having a bowel movement, or has stools that are dry, hard, or larger than normal. As people grow older, constipation is more common. A low-fiber diet, not taking in enough fluids, and taking certain medicines may make constipation worse.  CAUSES   Certain medicines, such as antidepressants, pain medicine, iron supplements, antacids, and water pills.   Certain diseases, such as diabetes, irritable bowel syndrome (IBS), thyroid disease, or depression.   Not drinking enough water.   Not eating enough fiber-rich foods.   Stress or travel.   Lack of physical activity or exercise.   Ignoring the urge to have a bowel movement.   Using laxatives too much.  SIGNS AND SYMPTOMS   Having fewer than three bowel movements a week.   Straining to have a bowel movement.   Having stools that are hard, dry, or larger than normal.   Feeling full or bloated.   Pain in the lower abdomen.   Not feeling relief after having a bowel movement.  DIAGNOSIS  Your health care provider will take a medical history and perform a physical exam. Further testing may be done for severe constipation. Some tests may include:  A barium enema X-ray to examine your rectum, colon, and, sometimes, your small intestine.   A sigmoidoscopy to examine your lower colon.   A colonoscopy to examine your entire colon. TREATMENT  Treatment will depend on the severity of your constipation and what is causing it. Some dietary treatments include drinking more fluids and eating more fiber-rich foods. Lifestyle treatments may include regular exercise. If these diet and lifestyle recommendations do not help, your health care provider may recommend taking over-the-counter laxative medicines to help you have bowel movements. Prescription medicines may be prescribed if over-the-counter medicines do not work.    HOME CARE INSTRUCTIONS   Eat foods that have a lot of fiber, such as fruits, vegetables, whole grains, and beans.  Limit foods high in fat and processed sugars, such as french fries, hamburgers, cookies, candies, and soda.   A fiber supplement may be added to your diet if you cannot get enough fiber from foods.   Drink enough fluids to keep your urine clear or pale yellow.   Exercise regularly or as directed by your health care provider.   Go to the restroom when you have the urge to go. Do not hold it.   Only take over-the-counter or prescription medicines as directed by your health care provider. Do not take other medicines for constipation without talking to your health care provider first.  SEEK IMMEDIATE MEDICAL CARE IF:   You have bright red blood in your stool.   Your constipation lasts for more than 4 days or gets worse.   You have abdominal or rectal pain.   You have thin, pencil-like stools.   You have unexplained weight loss. MAKE SURE YOU:   Understand these instructions.  Will watch your condition.  Will get help right away if you are not doing well or get worse.   This information is not intended to replace advice given to you by your health care provider. Make sure you discuss any questions you have with your health care provider.   Document Released: 12/01/2003 Document Revised: 03/25/2014 Document Reviewed: 12/14/2012 Elsevier Interactive Patient Education 2016 [employer] Inc[employer]  Disposable Sitz Bath A disposable sitz bath is a plastic basin that fits over the toilet. A bag is  hung above the toilet and is connected to a tube that opens into the disposable sitz bath. The bag is filled with warm water that can flow into the basin through the tube.  HOW TO USE A DISPOSABLE SITZ BATH  Close the clamp on the tubing before filling the bag with water. This is to prevent leakage.  Fill the sitz bath basin and the plastic bag with warm water.  Place the  filled basin on the toilet with the seat raised. Make sure the overflow opening is facing toward the back of the toilet.  Hang the filled plastic bag overhead on a hook or towel rack close to the toilet. When the bag is unclamped, a steady stream of water will flow from the bag, through the tubing, and into the basin.  Attach the tubing to the opening on the basin.  Sit on the basin positioned on the toilet seat and release the clamp. This will allow warm water to flush the area around your genitals and anus (perineum).  Remain sitting on the basin for approximately 15 to 20 minutes.  Stand up and pat the perineum area dry. If needed, apply clean bandages (dressings) to the affected area.  Tip the basin into the toilet to remove any remaining water and flush the toilet.  Wash the basin with warm water and soap. Let it dry in the sink.  Store the basin and tubing in a clean, dry area.  Wash your hands with soap and water. SEEK MEDICAL CARE IF: You get worse instead of better. Stop the sitz baths if you get worse. MAKE SURE YOU:  Understand these instructions.  Will watch your condition.  Will get help right away if you are not doing well or get worse.   This information is not intended to replace advice given to you by your health care provider. Make sure you discuss any questions you have with your health care provider.   Document Released: 09/03/2011 Document Revised: 11/27/2011 Document Reviewed: 09/03/2011 Elsevier Interactive Patient Education 2016 [employer]  Anal Fissure, Adult An anal fissure is a small tear or crack in the skin around the anus. Bleeding from a fissure usually stops on its own within a few minutes. However, bleeding will often occur again with each bowel movement until the crack heals. CAUSES This condition may be caused by:  Passing large, hard stool (feces).  Frequent diarrhea.  Constipation.  Inflammatory bowel disease (Crohn disease or  ulcerative colitis).  Infections.  Anal sex. SYMPTOMS Symptoms of this condition include:  Bleeding from the rectum.  Small amounts of blood seen on your stool, on toilet paper, or in the toilet after a bowel movement.  Painful bowel movements.  Itching or irritation around the anus. DIAGNOSIS A health care provider may diagnose this condition by closely examining the anal area. An anal fissure can usually be seen with careful inspection. In some cases, a rectal exam may be performed, or a short tube (anoscope) may be used to examine the anal canal. TREATMENT Treatment for this condition may include:  Taking steps to avoid constipation. This may include making changes to your diet, such as increasing your intake of fiber or fluid.  Taking fiber supplements. These supplements can soften your stool to help make bowel movements easier. Your health care provider may also prescribe a stool softener if your stool is often hard.  Taking sitz baths. This may help to heal the tear.  Using medicated creams or ointments. These may  be prescribed to lessen discomfort. HOME CARE INSTRUCTIONS Eating and Drinking  Avoid foods that may be constipating, such as bananas and dairy products.  Drink enough fluid to keep your urine clear or pale yellow.  Maintain a diet that is high in fruits, whole grains, and vegetables. General Instructions  Keep the anal area as clean and dry as possible.  Take sitz baths as told by your health care provider. Do not use soap in the sitz baths.  Take over-the-counter and prescription medicines only as told by your health care provider.  Use creams or ointments only as told by your health care provider.  Keep all follow-up visits as told by your health care provider. This is important. SEEK MEDICAL CARE IF:  You have more bleeding.  You have a fever.  You have diarrhea that is mixed with blood.  You continue to have pain.  Your problem is getting  worse rather than better.   This information is not intended to replace advice given to you by your health care provider. Make sure you discuss any questions you have with your health care provider.   Document Released: 03/04/2005 Document Revised: 11/23/2014 Document Reviewed: 05/30/2014 Elsevier Interactive Patient Education 2016 [employer].  Hemorrhoids Hemorrhoids are swollen veins around the rectum or anus. There are two types of hemorrhoids:   Internal hemorrhoids. These occur in the veins just inside the rectum. They may poke through to the outside and become irritated and painful.  External hemorrhoids. These occur in the veins outside the anus and can be felt as a painful swelling or hard lump near the anus. CAUSES  Pregnancy.   Obesity.   Constipation or diarrhea.   Straining to have a bowel movement.   Sitting for long periods on the toilet.  Heavy lifting or other activity that caused you to strain.  Anal intercourse. SYMPTOMS   Pain.   Anal itching or irritation.   Rectal bleeding.   Fecal leakage.   Anal swelling.   One or more lumps around the anus.  DIAGNOSIS  Your caregiver may be able to diagnose hemorrhoids by visual examination. Other examinations or tests that may be performed include:   Examination of the rectal area with a gloved hand (digital rectal exam).   Examination of anal canal using a small tube (scope).   A blood test if you have lost a significant amount of blood.  A test to look inside the colon (sigmoidoscopy or colonoscopy). TREATMENT Most hemorrhoids can be treated at home. However, if symptoms do not seem to be getting better or if you have a lot of rectal bleeding, your caregiver may perform a procedure to help make the hemorrhoids get smaller or remove them completely. Possible treatments include:   Placing a rubber band at the base of the hemorrhoid to cut off the circulation (rubber band ligation).    Injecting a chemical to shrink the hemorrhoid (sclerotherapy).   Using a tool to burn the hemorrhoid (infrared light therapy).   Surgically removing the hemorrhoid (hemorrhoidectomy).   Stapling the hemorrhoid to block blood flow to the tissue (hemorrhoid stapling).  HOME CARE INSTRUCTIONS   Eat foods with fiber, such as whole grains, beans, nuts, fruits, and vegetables. Ask your doctor about taking products with added fiber in them (fibersupplements).  Increase fluid intake. Drink enough water and fluids to keep your urine clear or pale yellow.   Exercise regularly.   Go to the bathroom when you have the urge to have  a bowel movement. Do not wait.   Avoid straining to have bowel movements.   Keep the anal area dry and clean. Use wet toilet paper or moist towelettes after a bowel movement.   Medicated creams and suppositories may be used or applied as directed.   Only take over-the-counter or prescription medicines as directed by your caregiver.   Take warm sitz baths for 15-20 minutes, 3-4 times a day to ease pain and discomfort.   Place ice packs on the hemorrhoids if they are tender and swollen. Using ice packs between sitz baths may be helpful.   Put ice in a plastic bag.   Place a towel between your skin and the bag.   Leave the ice on for 15-20 minutes, 3-4 times a day.   Do not use a donut-shaped pillow or sit on the toilet for long periods. This increases blood pooling and pain.  SEEK MEDICAL CARE IF:  You have increasing pain and swelling that is not controlled by treatment or medicine.  You have uncontrolled bleeding.  You have difficulty or you are unable to have a bowel movement.  You have pain or inflammation outside the area of the hemorrhoids. MAKE SURE YOU:  Understand these instructions.  Will watch your condition.  Will get help right away if you are not doing well or get worse.   This information is not intended to replace  advice given to you by your health care provider. Make sure you discuss any questions you have with your health care provider.   Document Released: 03/01/2000 Document Revised: 02/19/2012 Document Reviewed: 01/07/2012 Elsevier Interactive Patient Education [employer]

## 2015-03-07 NOTE — ED Notes (Signed)
Pt here for constipation; pt sts hx of same with IBS and has tried OTC meds without relief; pt c/o back pain

## 2015-03-07 NOTE — ED Notes (Signed)
Pt called out from restroom and states she had a large movement.  Pt c/o pain in the rectum at this time.  PA made aware.  Pt remains in restroom, this RN encouraged him to return to room when she is comfortable

## 2015-03-07 NOTE — ED Provider Notes (Signed)
CSN: [id number]     Arrival date & time 03/07/15  1827 History  By signing my name below, I, Carol Conley, attest that this documentation has been prepared under the direction and in the presence of Carol Berry, PA-C. Electronically Signed: Ronney Conley, ED Scribe. 03/07/2015. 11:03 PM.       Chief Complaint  Patient presents with  . Constipation   The history is provided by the patient. No language interpreter was used.    HPI Comments: Carol Conley is a 24 y.o. female with a history of IBS, constipation, colitis, and GERD, who presents to the Emergency Department complaining of constant, moderate constipation that began 1 week ago. She also complains of slight nausea, suprapubic abdominal pressure, right greater than left, and the sensation that she is "sitting on a baseball."  Patient reports a history of the same with IBS and tried a glycerin suppository this morning and 2 doses of Miralax last night without relief. She states she also tried to manually disimpact her stool and was able to remove some small, approximately 1 cm pieces of soft stool, but she became dizzy while doing so and stopped. She reports her last BM occurred 4 days ago. She states the pattern of her BMs change all the time and states this is normal for her. She states she had been drinking sweet tea with Miralax but had not eaten today. She reports taking [name] for GERD. She denies abdominal pain, vomiting, urinary retention, fever, chills, chest pain, or SOB.  Past Medical History  Diagnosis Date  . IBS (irritable bowel syndrome)   . Asthma   . Sinusitis   . IgA deficiency, selective (HCC)   . Colitis   . GERD (gastroesophageal reflux disease)    Past Surgical History  Procedure Laterality Date  . Ovarian cyst removal Right     2007   Family History  Problem Relation Age of Onset  . Cancer Father    Social History  Substance Use Topics  . Smoking status: Never Smoker   . Smokeless tobacco: Never Used   . Alcohol Use: No   OB History    Gravida Para Term Preterm AB TAB SAB Ectopic Multiple Living       Review of Systems  Constitutional: Negative.  Negative for fever and chills.  HENT: Negative.   Eyes: Negative.   Respiratory: Negative.  Negative for shortness of breath.   Cardiovascular: Negative.  Negative for chest pain.  Gastrointestinal: Positive for constipation. Negative for vomiting and abdominal pain.  Endocrine: Negative.   Genitourinary: Negative.  Negative for difficulty urinating.  Musculoskeletal: Negative.   Skin: Negative.   Allergic/Immunologic: Negative.   Neurological: Negative.   Hematological: Negative.   Psychiatric/Behavioral: Negative.   All other systems reviewed and are negative.   Allergies  Biaxin and Cefzil  Home Medications   [name]   Medication Sig Start Date End Date Taking? Authorizing Provider  [name];[name] Inhale 1-2 puffs into the lungs every 6 (six) hours as needed for wheezing or shortness of breath. Or cough 08/10/14   Rae Halsted, PA-C  aspirin-acetaminophen-caffeine (EXCEDRIN MIGRAINE) [phone] MG per tablet Take 2 tablets by mouth every 8 (eight) hours as needed for migraine.     Historical Provider, MD  dexlansoprazole ([name]) 60 MG capsule Take 60 mg by mouth daily.    Historical Provider, MD  dicyclomine (  BENTYL) 10 MG capsule Take 10 mg by mouth 4 (four) times daily -  before meals and at bedtime.    Historical Provider, MD  docusate sodium (COLACE) 100 MG capsule Take by mouth.    Historical Provider, MD  [name] Take 180 mg by mouth daily.    Historical Provider, MD  fluticasone (FLONASE) 50 MCG/ACT nasal spray Place 2 sprays into both nostrils daily. 08/10/14   Rae Halsted, PA-C  [name] Place 1 suppository (25 mg total) rectally 2 (two) times daily. 11/09/14    [name], MD  [name] 1-20 MG-MCG tablet Take 1 tablet by mouth daily. 10/13/14   Melody N Shambley, CNM  Lidocaine, [name], [name] Relief of anorectal pain and itching: Apply small, pea-sized amount of cream to affected area up to 6 times daily as needed 03/07/15   Carol Berry, PA-C  [name] Take 5 mg by mouth 4 (four) times daily.    Historical Provider, MD  [name] Take 1 tablet (4 mg total) by mouth every 8 (eight) hours as needed for nausea or vomiting. 10/20/14   [name], MD  [name] Place 1 application rectally 2 (two) times daily as needed for hemorrhoids. 03/07/15   Carol Berry, PA-C  [name] Take 1 tablet (25 mg total) by mouth every 6 (six) hours as needed for nausea or vomiting. 11/09/14   [name], MD   BP 109/75 mmHg  Pulse 84  Temp(Src) 98.3 F (36.8 C) (Oral)  Resp 16  SpO2 99% Physical Exam  Constitutional: She is oriented to person, place, and time. She appears well-developed and well-nourished. No distress.  Pt appears uncomfortable, NAD  HENT:  Head: Normocephalic and atraumatic.  Right Ear: External ear normal.  Left Ear: External ear normal.  Nose: Nose normal.  Mouth/Throat: Oropharynx is clear and moist. No oropharyngeal exudate.  Eyes: Conjunctivae and EOM are normal. Pupils are equal, round, and reactive to light. Right eye exhibits no discharge. Left eye exhibits no discharge. No scleral icterus.  Neck: Normal range of motion. Neck supple. No JVD present. No tracheal deviation present.  Cardiovascular: Normal rate and regular rhythm.   Pulmonary/Chest: Effort normal and breath sounds normal. No stridor. No respiratory distress.  Abdominal: Soft. Normal appearance and bowel sounds are normal. She exhibits no distension and no mass. There is generalized  tenderness. There is no rigidity, no rebound, no guarding and no CVA tenderness.  BS x 4 Generalized ttp with voluntary guarding, no rebound tenderness  Musculoskeletal: Normal range of motion. She exhibits no edema.  Lymphadenopathy:    She has no cervical adenopathy.  Neurological: She is alert and oriented to person, place, and time. She exhibits normal muscle tone. Coordination normal.  Skin: Skin is warm and dry. No rash noted. She is not diaphoretic. No erythema. No pallor.  Psychiatric: She has a normal mood and affect. Her behavior is normal. Judgment and thought content normal.  Nursing note and vitals reviewed.   ED Course  Procedures (including critical care time)  DIAGNOSTIC STUDIES: Oxygen Saturation is 96% on RA, normal by my interpretation.    COORDINATION OF CARE: 9:06 PM - Discussed treatment plan with pt at bedside which includes soap suds enema. Will also give nausea medication. Pt verbalized understanding and agreed to plan.   Imaging Review Dg  Abd Acute W/chest  03/07/2015  CLINICAL DATA:  Right-sided abdominal pain for 1 week, history of constipation EXAM: DG ABDOMEN ACUTE W/ 1V CHEST COMPARISON:  10/28/2014 FINDINGS: There is no evidence of dilated bowel loops or free intraperitoneal air. Fecal material is noted in the colon although no obstructive changes are seen. No radiopaque calculi or other significant radiographic abnormality is seen. Heart size and mediastinal contours are within normal limits. Both lungs are clear. IMPRESSION: No acute abnormality in the chest or abdomen. Electronically Signed   By: Alcide CleverMark  Lukens M.D.   On: 03/07/2015 20:24   I have personally reviewed and evaluated these images and lab results as part of my medical decision-making.   MDM   Pt with constipation, hx of IBS, constipation necessitating estimated 5 manual disimpactions, has hx of hemorrhoids and anal fissure. The patient states that she was able to disimpact some of her stool  by herself at home, she states it was somewhat soft, and she felt lightheaded so that is why she came to the ER tonight, she did not want passed out at home alone.  She has a history of intermittent soft stools and constipation. Her last bowel movement was on Friday (4 days ago) when she had multiple loose stools and she reports "emptying out" her system. She states that it is not surprising that she did not have a bowel movement for a few days. She states that she frequently holds her stool due to fear of pain and reinjuring of her anal fissure. She states that her hemorrhoids today are not as bad as previously and they're not her main concern.  Before coming to the ER she did try a glycerin suppository but it did not produce a bowel movement.  In the ER a soapsuds enema was ordered.  Plan was to try the soapsuds enema first to see if it will produce a bowel movement before doing a manual disimpaction, as the patient states she is already caused herself rectal pain trying to do the disimpaction herself at home.  The patient was able to pass a large amount of stool without difficulty after administration of approximately 1/4 of the enema.  She did have worsening rectal pain and lido jelly was ordered for pain relief.  She had significant improvement of her abdominal pain, nausea, and pressure on her low back. She refused rectal exam at this time. She states she is [facility] for management but she is not happy with her recent management. She did request GI referral.    Patient was given a prescription of topical lidocaine and also hemorrhoid cream.  She was referred to GI for further treatment of fissures, IBS, hemorrhoids. Patient may need nifedipine topical or nitroglycerin topical for treatment of her fissures, this will be deferred to her PCP and/or GI for further management.  Patient was discharged in satisfactory condition, vital signs stable.  She was encouraged to continue using  MiraLAX to prevent constipation.   Final diagnoses:  Constipation, unspecified constipation type  Hemorrhoids, unspecified hemorrhoid type  Chronic anal fissure    I personally performed the services described in this documentation, which was scribed in my presence. The recorded information has been reviewed and is accurate.       Carol BerryLeisa Ryheem Jay, PA-C 03/07/15 [id number]  Lavera Guiseana Duo Liu, MD 03/08/15 [phone]

## 2015-03-16 ENCOUNTER — Ambulatory Visit: Payer: 59

## 2015-03-23 ENCOUNTER — Ambulatory Visit (INDEPENDENT_AMBULATORY_CARE_PROVIDER_SITE_OTHER): Payer: 59 | Admitting: Obstetrics and Gynecology

## 2015-03-23 VITALS — BP 106/84 | HR 84 | Wt 137.0 lb

## 2015-03-23 DIAGNOSIS — Z23 Encounter for immunization: Secondary | ICD-10-CM

## 2015-03-23 MED ORDER — HPV QUADRIVALENT VACCINE IM SUSP: 0.5000 mL | Freq: Once | INTRAMUSCULAR | Status: DC

## 2015-03-23 NOTE — Progress Notes (Signed)
Patient ID: Carol Conley, female   DOB: 1990/10/06, 25 y.o.   MRN: [id number] Pt here for 3rd Gardasil. No side effects.

## 2015-04-12 ENCOUNTER — Ambulatory Visit: Payer: 59

## 2015-05-04 ENCOUNTER — Encounter: Payer: Self-pay | Admitting: Emergency Medicine

## 2015-05-04 ENCOUNTER — Emergency Department
Admission: EM | Admit: 2015-05-04 | Discharge: 2015-05-04 | Disposition: A | Discharge status: Home / Self Care | Payer: 59 | Attending: Student | Admitting: Student

## 2015-05-04 DIAGNOSIS — R1084 Generalized abdominal pain: Secondary | ICD-10-CM | POA: Insufficient documentation

## 2015-05-04 DIAGNOSIS — Z3202 Encounter for pregnancy test, result negative: Secondary | ICD-10-CM | POA: Diagnosis not present

## 2015-05-04 DIAGNOSIS — Z79899 Other long term (current) drug therapy: Secondary | ICD-10-CM | POA: Insufficient documentation

## 2015-05-04 DIAGNOSIS — R1110 Vomiting, unspecified: Secondary | ICD-10-CM | POA: Diagnosis present

## 2015-05-04 DIAGNOSIS — Z7951 Long term (current) use of inhaled steroids: Secondary | ICD-10-CM | POA: Diagnosis not present

## 2015-05-04 DIAGNOSIS — R1111 Vomiting without nausea: Secondary | ICD-10-CM | POA: Diagnosis not present

## 2015-05-04 DIAGNOSIS — M791 Myalgia: Secondary | ICD-10-CM | POA: Diagnosis not present

## 2015-05-04 DIAGNOSIS — R000 Tachycardia, unspecified: Secondary | ICD-10-CM | POA: Diagnosis not present

## 2015-05-04 LAB — COMPREHENSIVE METABOLIC PANEL
ALT: 15 U/L (ref 14–54)
AST: 22 U/L (ref 15–41)
Albumin: 4 g/dL (ref 3.5–5.0)
Alkaline Phosphatase: 70 U/L (ref 38–126)
Anion gap: 9 (ref 5–15)
BUN: 13 mg/dL (ref 6–20)
CHLORIDE: 106 mmol/L (ref 101–111)
CO2: 22 mmol/L (ref 22–32)
CREATININE: 0.84 mg/dL (ref 0.44–1.00)
Calcium: 9.1 mg/dL (ref 8.9–10.3)
GFR calc Af Amer: >60 mL/min (ref >60)
GFR calc non Af Amer: >60 mL/min (ref >60)
Glucose, Bld: 111 mg/dL — ABNORMAL HIGH (ref 65–99)
POTASSIUM: 3.8 mmol/L (ref 3.5–5.1)
SODIUM: 137 mmol/L (ref 135–145)
Total Bilirubin: 0.9 mg/dL (ref 0.3–1.2)
Total Protein: 7.7 g/dL (ref 6.5–8.1)

## 2015-05-04 LAB — URINALYSIS COMPLETE WITH MICROSCOPIC (ARMC ONLY)
BILIRUBIN URINE: NEGATIVE
Glucose, UA: NEGATIVE mg/dL
HGB URINE DIPSTICK: NEGATIVE
Leukocytes, UA: NEGATIVE
Nitrite: NEGATIVE
PH: 5 (ref 5.0–8.0)
PROTEIN: NEGATIVE mg/dL
Specific Gravity, Urine: 1.026 (ref 1.005–1.030)

## 2015-05-04 LAB — CBC
HEMATOCRIT: 40.8 % (ref 35.0–47.0)
Hemoglobin: 13.9 g/dL (ref 12.0–16.0)
MCH: 27.3 pg (ref 26.0–34.0)
MCHC: 34 g/dL (ref 32.0–36.0)
MCV: 80.4 fL (ref 80.0–100.0)
PLATELETS: 266 10*3/uL (ref 150–440)
RBC: 5.08 MIL/uL (ref 3.80–5.20)
RDW: 13.3 % (ref 11.5–14.5)
WBC: 10.7 10*3/uL (ref 3.6–11.0)

## 2015-05-04 LAB — POCT PREGNANCY, URINE: PREG TEST UR: NEGATIVE

## 2015-05-04 LAB — LIPASE, BLOOD: LIPASE: 16 U/L (ref 11–51)

## 2015-05-04 MED ORDER — ONDANSETRON HCL 4 MG/2ML IJ SOLN
4.0000 mg | Freq: Once | INTRAMUSCULAR | Status: AC
  Administered 2015-05-04: 4 mg via INTRAVENOUS

## 2015-05-04 MED ORDER — SODIUM CHLORIDE 0.9 % IV BOLUS (SEPSIS)
1000.0000 mL | Freq: Once | INTRAVENOUS | Status: AC
  Administered 2015-05-04: 1000 mL via INTRAVENOUS

## 2015-05-04 MED ORDER — KETOROLAC TROMETHAMINE 30 MG/ML IJ SOLN
30.0000 mg | Freq: Once | INTRAMUSCULAR | Status: AC
  Administered 2015-05-04: 30 mg via INTRAVENOUS
  Filled 2015-05-04: qty 1

## 2015-05-04 MED ORDER — PROMETHAZINE HCL 25 MG/ML IJ SOLN
12.5000 mg | Freq: Once | INTRAMUSCULAR | Status: AC
  Administered 2015-05-04: 12.5 mg via INTRAVENOUS
  Filled 2015-05-04 (×2): qty 1

## 2015-05-04 MED ORDER — ONDANSETRON HCL 4 MG/2ML IJ SOLN
INTRAMUSCULAR | Status: AC
  Administered 2015-05-04: 4 mg via INTRAVENOUS
  Filled 2015-05-04: qty 2

## 2015-05-04 NOTE — ED Notes (Signed)
Vomiting since last pm. No diarrhea. Only able to keep sips of water down.

## 2015-05-04 NOTE — ED Provider Notes (Signed)
[facility] Emergency Department Provider Note  ____________________________________________  Time seen: Approximately 4:23 PM  I have reviewed the triage vital signs and the nursing notes.   HISTORY  Chief Complaint Emesis    HPI Carol Conley is a 25 y.o. female history of asthma, GERD, IBS who presents for evaluation of less than 24 hours recurrent nonbloody nonbilious emesis, gradual onset, constant since onset, currently severe, no modifying factors. Patient has been taking her home Zofran and Phenergan without any relief. No fevers, no diarrhea. She reports that she is having abdominal cramping as well as diffuse myalgias. 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   Past Medical History  Diagnosis Date  . IBS (irritable bowel syndrome)   . Asthma   . Sinusitis   . IgA deficiency, selective (HCC)   . Colitis   . GERD (gastroesophageal reflux disease)     Patient Active Problem List   Diagnosis Date Noted  . Abdominal pain 10/20/2014  . Abdominal pain, epigastric 10/20/2014  . 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 Feeling bilious 10/20/2014  . Acid reflux 10/20/2014  . Hemorrhoids, internal 10/20/2014  . Hemorrhoid 10/20/2014  . H/O gastrointestinal disease 10/20/2014  . 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 IgA deficiency, isolated (HCC) 10/20/2014  . Colitis 03/08/2014    Past Surgical History  Procedure Laterality Date  . Ovarian cyst removal Right     2007    Current Outpatient Rx  Name  Route  Sig  Dispense  Refill  . [name];[name]   Inhalation   Inhale 1-2 puffs into the lungs every 6 (six) hours as needed for wheezing or shortness of breath. Or cough   1 Inhaler   0   . aspirin-acetaminophen-caffeine (EXCEDRIN MIGRAINE) 250-250-65 MG per tablet   Oral   Take 2 tablets by mouth every 8 (eight) hours as needed for migraine.          Marland Kitchen  dexlansoprazole ([name]) 60 MG capsule   Oral   Take 60 mg by mouth daily.         Marland Kitchen [name]   Oral   Take 10 mg by mouth 4 (four) times daily -  before meals and at bedtime.         . docusate sodium (COLACE) 100 MG capsule   Oral   Take by mouth.         . [name]   Oral   Take 180 mg by mouth daily.         . fluticasone (FLONASE) 50 MCG/ACT nasal spray   Each Nare   Place 2 sprays into both nostrils daily.   16 g   2   . [name]   Rectal   Place 1 suppository (25 mg total) rectally 2 (two) times daily.   14 suppository   2   . [name] 1-20 MG-MCG tablet   Oral   Take 1 tablet by mouth daily.   4 Package   4     Dispense as written.    Taking continuously, and needs this brand specific ...   . Lidocaine, [name], [name]      Relief of anorectal pain and itching: Apply small, pea-sized amount of cream to affected area up to 6 times daily as needed   30 g   1   .  [name]   Oral   Take 5 mg by mouth 4 (four) times daily.         . [name]   Oral   Take 1 tablet (4 mg total) by mouth every 8 (eight) hours as needed for nausea or vomiting.   30 tablet   5   . [name]   Rectal   Place 1 application rectally 2 (two) times daily as needed for hemorrhoids.   30 g   0   . [name]   Oral   Take 1 tablet (25 mg total) by mouth every 6 (six) hours as needed for nausea or vomiting.   30 tablet   6     Allergies Biaxin and Cefzil  Family History  Problem Relation Age of Onset  . Cancer Father     Social History Social History  Substance Use Topics  . Smoking status: Never Smoker   . Smokeless tobacco: Never Used  . Alcohol Use: No    Review  of Systems Constitutional: No fever/chills Eyes: No visual changes. ENT: No sore throat. Cardiovascular: Denies chest pain. Respiratory: Denies shortness of breath. Gastrointestinal: + abdominal pain.  + nausea, + vomiting.  No diarrhea.  No constipation. Genitourinary: Negative for dysuria. Musculoskeletal: Negative for back pain. Skin: Negative for rash. Neurological: Negative for headaches, focal weakness or numbness.  10-point ROS otherwise negative.  ____________________________________________   PHYSICAL EXAM:  VITAL SIGNS: ED Triage Vitals  Enc Vitals Group     BP 05/04/15 1558 125/58 mmHg     Pulse Rate 05/04/15 1558 135     Resp 05/04/15 1558 18     Temp 05/04/15 1558 99.1 F (37.3 C)     Temp Source 05/04/15 1558 Oral     SpO2 05/04/15 1558 97 %     Weight 05/04/15 1558 140 lb (63.504 kg)     Height 05/04/15 1558 5\' 5"  (1.651 m)     Head Cir --      Peak Flow --      Pain Score 05/04/15 1558 6     Pain Loc --      Pain Edu? --      Excl. in GC? --     Constitutional: Alert and oriented. Active retching. Eyes: Conjunctivae are normal. PERRL. EOMI. Head: Atraumatic. Nose: No congestion/rhinnorhea. Mouth/Throat: Mucous membranes are dry.  Oropharynx non-erythematous. Neck: No stridor. Supple without meningismus. Cardiovascular: tachycardic rate, regular rhythm. Grossly normal heart sounds.  Good peripheral circulation. Respiratory: Normal respiratory effort.  No retractions. Lungs CTAB. Gastrointestinal: Normal bowel sounds. Soft with mild diffuse tenderness, no rebound, no guarding. No CVA tenderness. Genitourinary: Deferred Musculoskeletal: No lower extremity tenderness nor edema.  No joint effusions. Neurologic:  Normal speech and language. No gross focal neurologic deficits are appreciated.  Skin:  Skin is warm, dry and intact. No rash noted. Psychiatric: Mood and affect are normal. Speech and behavior are  normal.  ____________________________________________   LABS (all labs ordered are listed, but only abnormal results are displayed)  Labs Reviewed  COMPREHENSIVE METABOLIC PANEL - Abnormal; Notable for the following:    Glucose, Bld 111 (*)    All other components within normal limits  URINALYSIS COMPLETEWITH MICROSCOPIC ([facility] ONLY) - Abnormal; Notable for the following:    Color, Urine YELLOW (*)    APPearance CLEAR (*)    Ketones, ur 1+ (*)  Bacteria, UA RARE (*)    Squamous Epithelial / LPF 0-5 (*)    All other components within normal limits  LIPASE, BLOOD  CBC  POC URINE PREG, ED  POCT PREGNANCY, URINE   ____________________________________________  EKG  none ____________________________________________  RADIOLOGY  none ____________________________________________   PROCEDURES  Procedure(s) performed: None  Critical Care performed: No  ____________________________________________   INITIAL IMPRESSION / ASSESSMENT AND PLAN / ED COURSE  Pertinent labs & imaging results that were available during my care of the patient were reviewed by me and considered in my medical decision making (see chart for details).  Pernie Grosso is a 25 y.o. female history of asthma, GERD, IBS who presents for evaluation of less than 24 hours recurrent nonbloody nonbilious emesis as well as abdominal pain and myalgias. On arrival to the emergency department she is actively retching, tachycardic but afebrile. She has mild diffuse tenderness throughout the abdomen. We'll obtain screening labs, treat her symptoms, give IV fluids, reassess for disposition.  ----------------------------------------- 7:06 PM on 05/04/2015 ----------------------------------------- Labs reviewed. CBC, CMP, lipase, urinalysis and pregnancy test are all negative/unremarkable. Patient reports she feels better at this time. Tachycardia has resolved. Blood pressure stable. She is tolerating by mouth  intake. DC with return precautions and close PCP follow-up she is comfortable with the discharge plan.  ____________________________________________   FINAL CLINICAL IMPRESSION(S) / ED DIAGNOSES  Final diagnoses:  Non-intractable vomiting without nausea, unspecified vomiting type  Generalized abdominal pain      Gayla Doss, MD 05/04/15 1907

## 2015-05-18 ENCOUNTER — Encounter: Payer: Self-pay | Admitting: Gastroenterology

## 2015-05-18 ENCOUNTER — Ambulatory Visit (INDEPENDENT_AMBULATORY_CARE_PROVIDER_SITE_OTHER): Payer: 59 | Admitting: Gastroenterology

## 2015-05-18 ENCOUNTER — Other Ambulatory Visit: Payer: Self-pay

## 2015-05-18 VITALS — BP 123/72 | HR 103 | Temp 98.4°F | Ht 65.0 in | Wt 137.0 lb

## 2015-05-18 DIAGNOSIS — K589 Irritable bowel syndrome without diarrhea: Secondary | ICD-10-CM | POA: Diagnosis not present

## 2015-05-18 NOTE — Progress Notes (Signed)
Primary Care Physician: No PCP Per Patient  Primary Gastroenterologist:  Dr. [name]  Chief Complaint  Patient presents with  . Gastroesophageal Reflux  . Constipation    HPI: Carol Conley is a 25 y.o. female here for rectal pain and constipation. The patient states that she was on Linzess in the past but made her stools unmanageable due to the consistency. The patient also has been titrating MiraLAX off and on but was in the emergency room before Christmas for severe constipation. The patient had to be disimpacted at that time. The patient now reports that she has continued trouble moving her bowels with hard stools. She also reports that when she is straining at stools she has a lot of heartburn at that time. The patient also reports that she has rectal pain when she moves her bowels.  Current Outpatient Prescriptions  Medication Sig Dispense Refill  . [name];[name] Inhale 1-2 puffs into the lungs every 6 (six) hours as needed for wheezing or shortness of breath. Or cough 1 Inhaler 0  . aspirin-acetaminophen-caffeine (EXCEDRIN MIGRAINE) 250-250-65 MG per tablet Take 2 tablets by mouth every 8 (eight) hours as needed for migraine.     Marland Kitchen dexlansoprazole ([name]) 60 MG capsule Take 60 mg by mouth daily.    Marland Kitchen [name] Take 10 mg by mouth 4 (four) times daily -  before meals and at bedtime.    . [name] Take 180 mg by mouth daily.    . fluticasone (FLONASE) 50 MCG/ACT nasal spray Place 2 sprays into both nostrils daily. 16 g 2  . [name] Place 1 suppository (25 mg total) rectally 2 (two) times daily. 14 suppository 2  . [name] 1-20 MG-MCG tablet Take 1 tablet by mouth daily. 4 Package 4  . Lidocaine, [name], [name] Relief of anorectal pain and itching: Apply small, pea-sized amount of cream to affected area up to 6 times  daily as needed 30 g 1  . [name] Take 5 mg by mouth 4 (four) times daily.    . Multiple Vitamin (MULTIVITAMIN) tablet Take 1 tablet by mouth daily.    . [name] Take 1 tablet (4 mg total) by mouth every 8 (eight) hours as needed for nausea or vomiting. 30 tablet 5  . [name] Place 1 application rectally 2 (two) times daily as needed for hemorrhoids. 30 g 0  . [name] Take 1 tablet (25 mg total) by mouth every 6 (six) hours as needed for nausea or vomiting. 30 tablet 6  . docusate sodium (COLACE) 100 MG capsule Take by mouth. Reported on 05/18/2015     No current facility-administered medications for this visit.    Allergies as of 05/18/2015 - Review Complete 05/18/2015  Allergen Reaction Noted  . [name] Hives 08/10/2014  . [name] Hives 08/10/2014    ROS:  General: Negative for anorexia, weight loss, fever, chills, fatigue, weakness. ENT: Negative for hoarseness, difficulty swallowing , nasal congestion. CV: Negative for chest pain, angina, palpitations, dyspnea on exertion, peripheral edema.  Respiratory: Negative for dyspnea at rest, dyspnea on exertion, cough, sputum, wheezing.  GI: See history of present illness. GU:  Negative for dysuria, hematuria, urinary incontinence, urinary frequency, nocturnal urination.  Endo: Negative for unusual weight change.  Rectal: Anal fissure at 12:00 without any blood but significant tenderness  Physical Examination:   BP 123/72 mmHg  Pulse 103  Temp(Src) 98.4 F (36.9 C) (Oral)  Ht  (1.651 m)  Wt 137 lb (62.143 kg)  BMI 22.80 kg/m2  General: Well-nourished, well-developed in no acute distress.  Eyes: No icterus. Conjunctivae pink. Mouth: Oropharyngeal mucosa moist and pink , no lesions erythema or exudate. Lungs:  Clear to auscultation bilaterally. Non-labored. Heart: Regular rate and rhythm, no murmurs rubs or gallops.  Abdomen: Bowel sounds are normal, nontender, nondistended, no hepatosplenomegaly or masses, no abdominal bruits or hernia , no rebound or guarding.   Extremities: No lower extremity edema. No clubbing or deformities. Neuro: Alert and oriented x 3.  Grossly intact. Skin: Warm and dry, no jaundice.   Psych: Alert and cooperative, normal mood and affect.  Labs:    Imaging Studies: No results found.  Assessment and Plan:   Carol Conley is a 25 y.o. y/o female who comes in today with an anal fissure that is likely due to her chronic constipation. The patient has been started on Amitiza. The patient has been given samples both strengths. She will try each one and see which works for her better. She will also continue the [name] to try taking it at night prior to going to sleep to help her with nocturnal acid breakthrough. The patient has been explained the plan and agrees with it.   Note: This dictation was prepared with Dragon dictation along with smaller phrase technology. Any transcriptional errors that result from this process are unintentional.

## 2015-05-31 ENCOUNTER — Telehealth: Payer: Self-pay | Admitting: Gastroenterology

## 2015-05-31 NOTE — Telephone Encounter (Signed)
Patient was given samples of Amitiba and needs a RX called in 24 micrograms 2x's a day  [employer].

## 2015-06-01 ENCOUNTER — Other Ambulatory Visit: Payer: Self-pay

## 2015-06-01 DIAGNOSIS — K5909 Other constipation: Secondary | ICD-10-CM

## 2015-06-01 MED ORDER — LUBIPROSTONE 24 MCG PO CAPS: 24.0000 ug | ORAL_CAPSULE | Freq: Two times a day (BID) | ORAL | Status: DC

## 2015-06-15 ENCOUNTER — Ambulatory Visit: Payer: 59 | Admitting: Gastroenterology

## 2015-08-16 ENCOUNTER — Ambulatory Visit
Admission: EM | Admit: 2015-08-16 | Discharge: 2015-08-16 | Disposition: A | Discharge status: Home / Self Care | Payer: 59 | Attending: Family Medicine | Admitting: Family Medicine

## 2015-08-16 ENCOUNTER — Encounter: Payer: Self-pay | Admitting: *Deleted

## 2015-08-16 DIAGNOSIS — R1084 Generalized abdominal pain: Secondary | ICD-10-CM | POA: Diagnosis not present

## 2015-08-16 LAB — CBC WITH DIFFERENTIAL/PLATELET
BASOS ABS: 0 10*3/uL (ref 0–0.1)
Basophils Relative: 1 %
EOS ABS: 0.1 10*3/uL (ref 0–0.7)
EOS PCT: 2 %
HCT: 39.3 % (ref 35.0–47.0)
Hemoglobin: 13.3 g/dL (ref 12.0–16.0)
LYMPHS ABS: 1.2 10*3/uL (ref 1.0–3.6)
LYMPHS PCT: 25 %
MCH: 27.5 pg (ref 26.0–34.0)
MCHC: 33.8 g/dL (ref 32.0–36.0)
MCV: 81.4 fL (ref 80.0–100.0)
MONO ABS: 0.4 10*3/uL (ref 0.2–0.9)
Monocytes Relative: 8 %
Neutro Abs: 3.1 10*3/uL (ref 1.4–6.5)
Neutrophils Relative %: 64 %
PLATELETS: 229 10*3/uL (ref 150–440)
RBC: 4.83 MIL/uL (ref 3.80–5.20)
RDW: 13.3 % (ref 11.5–14.5)
WBC: 4.9 10*3/uL (ref 3.6–11.0)

## 2015-08-16 LAB — COMPREHENSIVE METABOLIC PANEL
ALT: 14 U/L (ref 14–54)
AST: 19 U/L (ref 15–41)
Albumin: 3.8 g/dL (ref 3.5–5.0)
Alkaline Phosphatase: 68 U/L (ref 38–126)
Anion gap: 7 (ref 5–15)
BUN: 12 mg/dL (ref 6–20)
CALCIUM: 8.7 mg/dL — AB (ref 8.9–10.3)
CHLORIDE: 104 mmol/L (ref 101–111)
CO2: 26 mmol/L (ref 22–32)
CREATININE: 0.93 mg/dL (ref 0.44–1.00)
Glucose, Bld: 83 mg/dL (ref 65–99)
POTASSIUM: 3.6 mmol/L (ref 3.5–5.1)
Sodium: 137 mmol/L (ref 135–145)
Total Bilirubin: 0.6 mg/dL (ref 0.3–1.2)
Total Protein: 7.3 g/dL (ref 6.5–8.1)

## 2015-08-16 MED ORDER — ONDANSETRON 8 MG PO TBDP
8.0000 mg | ORAL_TABLET | Freq: Once | ORAL | Status: AC
  Administered 2015-08-16: 8 mg via ORAL

## 2015-08-16 MED ORDER — GI COCKTAIL ~~LOC~~
30.0000 mL | Freq: Once | ORAL | Status: AC
  Administered 2015-08-16: 30 mL via ORAL

## 2015-08-16 NOTE — Discharge Instructions (Signed)
Follow up with your Gastroenterologist  Pepcid TUMS Clear liquids diet then advance as tolerated

## 2015-08-16 NOTE — ED Notes (Signed)
Pt c/o nausea and acid reflux. Pt states she has been taking antinausea medication around the clock. Pt has history of GERD and IBS, Hiatal hernia also reported.

## 2015-08-17 ENCOUNTER — Ambulatory Visit (INDEPENDENT_AMBULATORY_CARE_PROVIDER_SITE_OTHER): Payer: 59 | Admitting: Gastroenterology

## 2015-08-17 ENCOUNTER — Encounter: Payer: Self-pay | Admitting: Gastroenterology

## 2015-08-17 VITALS — BP 94/66 | HR 97 | Temp 98.5°F | Ht 65.0 in | Wt 133.0 lb

## 2015-08-17 DIAGNOSIS — K589 Irritable bowel syndrome without diarrhea: Secondary | ICD-10-CM | POA: Diagnosis not present

## 2015-08-17 MED ORDER — DICYCLOMINE HCL 10 MG PO CAPS: 10.0000 mg | ORAL_CAPSULE | Freq: Three times a day (TID) | ORAL | Status: DC

## 2015-08-17 MED ORDER — CITALOPRAM HYDROBROMIDE 10 MG PO TABS: 10.0000 mg | ORAL_TABLET | Freq: Every day | ORAL | Status: DC

## 2015-08-17 NOTE — Progress Notes (Addendum)
Primary Care Physician: No PCP Per Patient  Primary Gastroenterologist:  Dr. [name]  Chief Complaint  Patient presents with  . severe heartburn  . Abdominal Pain    HPI: Carol Conley is a 25 y.o. female here Recurrent abdominal pain and a long history of Irritable  bowel syndrome. The patient has had a workup including EGD and colonoscopy for alternating diarrhea and constipation abdominal pain. The patient has also had imaging including a gallbladder emptying study. The patient's workup has been negative and she has a family history of severe IBS in her father and she thinks her brother also suffers from it. The patient reports that she has been having increased pain in the left upper quadrant. The patient states that the pain is worse with foods she eats including lemonade. The patient also has taken anti-inflammatory medications. She was on a PPI and takes Pepcid with Tums as needed. She also was put on dicyclomine for which she states she only takes once in a while. The patient was tried on desipramine but was unable to tolerate it because it caused severe constipation.  Current Outpatient Prescriptions  Medication Sig Dispense Refill  . [name];[name] Inhale 1-2 puffs into the lungs every 6 (six) hours as needed for wheezing or shortness of breath. Or cough 1 Inhaler 0  . aspirin-acetaminophen-caffeine (EXCEDRIN MIGRAINE) 250-250-65 MG per tablet Take 2 tablets by mouth every 8 (eight) hours as needed for migraine.     Marland Kitchen dexlansoprazole ([name]) 60 MG capsule Take 60 mg by mouth daily.    Marland Kitchen [name] Take 1 capsule (10 mg total) by mouth 4 (four) times daily -  before meals and at bedtime. 90 capsule 2  . docusate sodium (COLACE) 100 MG capsule Take by mouth. Reported on 05/18/2015    . [name] Take 180 mg by mouth daily.    . fluticasone (FLONASE) 50 MCG/ACT nasal spray  Place 2 sprays into both nostrils daily. 16 g 2  . [name] Place 1 suppository (25 mg total) rectally 2 (two) times daily. 14 suppository 2  . [name] 1-20 MG-MCG tablet Take 1 tablet by mouth daily. 4 Package 4  . Lidocaine, [name], [name] Relief of anorectal pain and itching: Apply small, pea-sized amount of cream to affected area up to 6 times daily as needed 30 g 1  . lubiprostone (AMITIZA) 24 MCG capsule Take 1 capsule (24 mcg total) by mouth 2 (two) times daily with a meal. 60 capsule 11  . [name] Take 5 mg by mouth 4 (four) times daily.    . Multiple Vitamin (MULTIVITAMIN) tablet Take 1 tablet by mouth daily.    . [name] Take 1 tablet (4 mg total) by mouth every 8 (eight) hours as needed for nausea or vomiting. 30 tablet 5  . [name] Place 1 application rectally 2 (two) times daily as needed for hemorrhoids. 30 g 0  . [name] Take 1 tablet (25 mg total) by mouth every 6 (six) hours as needed for nausea or vomiting. 30 tablet 6  . citalopram (CELEXA) 10 MG tablet Take 1 tablet (10 mg total) by mouth daily. 30 tablet 5   No current facility-administered medications for this visit.    Allergies as of 08/17/2015 -  Review Complete 08/17/2015  Allergen Reaction Noted  . [name] Hives 08/10/2014  . [name] Hives 08/10/2014    ROS:  General: Negative for anorexia, weight loss, fever, chills, fatigue, weakness. ENT: Negative for hoarseness, difficulty swallowing , nasal congestion. CV: Negative for chest pain, angina, palpitations, dyspnea on exertion, peripheral edema.  Respiratory: Negative for dyspnea at rest, dyspnea on exertion, cough, sputum, wheezing.  GI: See history of present illness. GU:  Negative for dysuria,  hematuria, urinary incontinence, urinary frequency, nocturnal urination.  Endo: Negative for unusual weight change.    Physical Examination:   BP 94/66 mmHg  Pulse 97  Temp(Src) 98.5 F (36.9 C) (Oral)  Ht 5\' 5"  (1.651 m)  Wt 133 lb (60.328 kg)  BMI 22.13 kg/m2  General: Well-nourished, well-developed in no acute distress.  Eyes: No icterus. Conjunctivae pink. Mouth: Oropharyngeal mucosa moist and pink , no lesions erythema or exudate. Lungs: Clear to auscultation bilaterally. Non-labored. Heart: Regular rate and rhythm, no murmurs rubs or gallops.  Abdomen: Bowel sounds are normal, Diffuse abdominal tenderness, nondistended, no hepatosplenomegaly or masses, no abdominal bruits or hernia , no rebound or guarding.   Extremities: No lower extremity edema. No clubbing or deformities. Neuro: Alert and oriented x 3.  Grossly intact. Skin: Warm and dry, no jaundice.   Psych: Alert and cooperative, normal mood and affect.  Labs:    Imaging Studies: No results found.  Assessment and Plan:   Stann Carol Conley is a 25 y.o. y/o female who comes in with a flare of her irritable bowel syndrome. The patient will be put on the dicyclomine 10 mg 3 times a day. The patient will keep taking her present medications and Celexa will be added right before bedtime to help with her sleep and with her irritable bowel syndrome. The patient has been explained the plan and agrees with it.   Note: This dictation was prepared with Dragon dictation along with smaller phrase technology. Any transcriptional errors that result from this process are unintentional.

## 2015-08-23 ENCOUNTER — Telehealth: Payer: Self-pay | Admitting: Gastroenterology

## 2015-08-23 ENCOUNTER — Other Ambulatory Visit: Payer: Self-pay

## 2015-08-23 MED ORDER — DEXLANSOPRAZOLE 60 MG PO CPDR: 60.0000 mg | DELAYED_RELEASE_CAPSULE | Freq: Every day | ORAL | Status: DC

## 2015-08-23 NOTE — Telephone Encounter (Signed)
Pt notified rx refill for dexilant was sent to [facility] pharmacy.

## 2015-08-23 NOTE — Telephone Encounter (Signed)
[place] outpatient pharmacy. Patient called and needs refill on [name]

## 2015-08-31 ENCOUNTER — Ambulatory Visit (INDEPENDENT_AMBULATORY_CARE_PROVIDER_SITE_OTHER): Payer: 59 | Admitting: Family Medicine

## 2015-08-31 ENCOUNTER — Encounter: Payer: Self-pay | Admitting: Family Medicine

## 2015-08-31 VITALS — BP 100/67 | HR 77 | Temp 98.6°F | Resp 16 | Ht 65.0 in | Wt 134.0 lb

## 2015-08-31 DIAGNOSIS — J4520 Mild intermittent asthma, uncomplicated: Secondary | ICD-10-CM | POA: Diagnosis not present

## 2015-08-31 DIAGNOSIS — K219 Gastro-esophageal reflux disease without esophagitis: Secondary | ICD-10-CM | POA: Diagnosis not present

## 2015-08-31 DIAGNOSIS — R350 Frequency of micturition: Secondary | ICD-10-CM | POA: Diagnosis not present

## 2015-08-31 DIAGNOSIS — K589 Irritable bowel syndrome without diarrhea: Secondary | ICD-10-CM

## 2015-08-31 DIAGNOSIS — E785 Hyperlipidemia, unspecified: Secondary | ICD-10-CM

## 2015-08-31 DIAGNOSIS — K598 Other specified functional intestinal disorders: Secondary | ICD-10-CM

## 2015-08-31 DIAGNOSIS — N390 Urinary tract infection, site not specified: Secondary | ICD-10-CM | POA: Diagnosis not present

## 2015-08-31 LAB — POCT URINALYSIS DIPSTICK
Bilirubin, UA: NEGATIVE
Blood, UA: NEGATIVE
GLUCOSE UA: NEGATIVE
KETONES UA: NEGATIVE
LEUKOCYTES UA: NEGATIVE
Nitrite, UA: NEGATIVE
PROTEIN UA: NEGATIVE
Spec Grav, UA: 1.015
Urobilinogen, UA: NEGATIVE
pH, UA: 5

## 2015-08-31 MED ORDER — ALBUTEROL SULFATE HFA 108 (90 BASE) MCG/ACT IN AERS: 1.0000 | INHALATION_SPRAY | Freq: Four times a day (QID) | RESPIRATORY_TRACT | Status: DC | PRN

## 2015-08-31 NOTE — Assessment & Plan Note (Signed)
Well controlled. Renew PRN inhalers.

## 2015-08-31 NOTE — Patient Instructions (Signed)
Avoid bladder irritants such as caffeine, acidic beverages, coffee or tea. Drink plenty of water. I think your symptoms are a combination of the celexa and IBS symptoms. We will culture your urine to ensure there is no infection.    If you develop severe flank pain, blood in the urine, fever, nausea or vomiting, please seek immediate medical attention in the ER.

## 2015-08-31 NOTE — Assessment & Plan Note (Signed)
Managed by GI 

## 2015-08-31 NOTE — Progress Notes (Signed)
Subjective:    Patient ID: Carol Conley, female    DOB: 1990-04-27, 25 y.o.   MRN: [id number]  HPI: Carol Conley is a 25 y.o. female presenting on 08/31/2015 for Establish Care   HPI  Pt presents to establish care today. Previous care provider was none here. Seeing Dr. Servando Snare for IBS  It has been 2  weeks since her last PCP visit. Records from previous provider will be requested and reviewed. Current medical problems include:  Dysuria: 3 weeks of urinary frequency. Since starting celexa has noticed worsening. No vaginal discharge. Not sexually.  IBS: CUrrently being managed by Dr. Servando Snare- started on low dose celexa 1 week ago. Mixed IBS. Has had bowel issues since childhood. Takes bentyl TID.  GERD: Taking dexliant. Has a small hiatal hernia. Last EGD 03/2014- managed by Dr. Servando Snare. Asthma: Diagnosed as a child. Only has problems when sick. No recent asthma exacerbation. Some coughing- thinks it's reflux.   Health maintenance:  Never had a pap. Sees Melody [employer] Works as Nutritional therapist at [employer].     Past Medical History  Diagnosis Date  . IBS (irritable bowel syndrome)   . Asthma   . Sinusitis   . IgA deficiency, selective (HCC)   . Colitis   . GERD (gastroesophageal reflux disease)   . Allergy    Social History   Social History  . [name]    [name]  . [name]  . [name]   Occupational History  . Not on file.   Social History Main Topics  . Smoking status: Never Smoker   . Smokeless tobacco: Never Used  . Alcohol Use: No  . [name]  . [name]   Other Topics Concern  . Not on file   Social History Narrative   Family History  Problem Relation Age of Onset  . Cancer Father     skin   Current Outpatient Prescriptions on File Prior to Visit  Medication Sig  . aspirin-acetaminophen-caffeine (EXCEDRIN MIGRAINE) 250-250-65 MG per tablet Take 2 tablets by mouth every 8 (eight) hours as  needed for migraine.   . citalopram (CELEXA) 10 MG tablet Take 1 tablet (10 mg total) by mouth daily.  Marland Kitchen dexlansoprazole ([name]) 60 MG capsule Take 1 capsule (60 mg total) by mouth daily.  Marland Kitchen [name] Take 1 capsule (10 mg total) by mouth 4 (four) times daily -  before meals and at bedtime.  . docusate sodium (COLACE) 100 MG capsule Take by mouth. Reported on 05/18/2015  . [name] Take 180 mg by mouth daily.  . fluticasone (FLONASE) 50 MCG/ACT nasal spray Place 2 sprays into both nostrils daily.  . [name] Place 1 suppository (25 mg total) rectally 2 (two) times daily.  Colleen Can FE 1/20 1-20 MG-MCG tablet Take 1 tablet by mouth daily.  . Lidocaine, [name], [name] Relief of anorectal pain and itching: Apply small, pea-sized amount of cream to affected area up to 6 times daily as needed  . [name] Take 5 mg by mouth 4 (four) times daily.  . Multiple Vitamin (MULTIVITAMIN) tablet Take 1 tablet by mouth daily.  . [name] Take 1 tablet (4 mg total) by mouth every 8 (eight) hours as needed for nausea or vomiting.  . phenylephrine-shark liver oil-mineral oil-petrolatum (PREPARATION H) 0.25-3-14-71.9 %  rectal ointment Place 1 application rectally 2 (two) times daily as needed for hemorrhoids.  . [name] Take 1 tablet (25 mg total) by mouth every 6 (six) hours as needed for nausea or vomiting.   No current facility-administered medications on file prior to visit.    Review of Systems  Constitutional: Negative for fever and chills.  HENT: Negative.   Respiratory: Negative for cough, chest tightness and wheezing.   Cardiovascular: Negative for chest pain and leg swelling.  Gastrointestinal: Positive for abdominal pain, diarrhea and constipation. Negative for nausea and vomiting.  Endocrine: Negative.   Negative for cold intolerance, heat intolerance, polydipsia, polyphagia and polyuria.  Genitourinary: Positive for frequency and difficulty urinating. Negative for dysuria, urgency, flank pain, vaginal bleeding, vaginal discharge, vaginal pain and pelvic pain.  Musculoskeletal: Negative.   Neurological: Negative for dizziness, light-headedness and numbness.  Psychiatric/Behavioral: Negative.    Per HPI unless specifically indicated above     Objective:    BP 100/67 mmHg  Pulse 77  Temp(Src) 98.6 F (37 C) (Oral)  Resp 16  Ht 5\' 5"  (1.651 m)  Wt 134 lb (60.782 kg)  BMI 22.30 kg/m2  Wt Readings from Last 3 Encounters:  08/31/15 134 lb (60.782 kg)  08/17/15 133 lb (60.328 kg)  08/16/15 138 lb (62.596 kg)    Physical Exam  Constitutional: She is oriented to person, place, and time. She appears well-developed and well-nourished.  HENT:  Head: Normocephalic and atraumatic.  Neck: Neck supple.  Cardiovascular: Normal rate, regular rhythm and normal heart sounds.  Exam reveals no gallop and no friction rub.   No murmur heard. Pulmonary/Chest: Effort normal and breath sounds normal. She has no wheezes. She exhibits no tenderness.  Abdominal: Soft. Normal appearance and bowel sounds are normal. She exhibits no shifting dullness, no distension, no abdominal bruit, no ascites and no mass. There is generalized tenderness. There is no rigidity, no rebound, no guarding, no CVA tenderness, no tenderness at McBurney's point and negative Murphy's sign.  Musculoskeletal: Normal range of motion. She exhibits no edema or tenderness.  Lymphadenopathy:    She has no cervical adenopathy.  Neurological: She is alert and oriented to person, place, and time.  Skin: Skin is warm and dry.   Results for orders placed or performed in visit on 08/31/15  POCT urinalysis dipstick  Result Value Ref Range   Color, UA amber    Clarity, UA clear    Glucose, UA negative    Bilirubin, UA negative    Ketones,  UA negative    Spec Grav, UA 1.015    Blood, UA negative    pH, UA 5.0    Protein, UA negative    Urobilinogen, UA negative    Nitrite, UA negative    Leukocytes, UA Negative Negative      Assessment & Plan:   Problem List Items Addressed This Visit      Respiratory   Asthma, mild intermittent    Well controlled. Renew PRN inhalers.       Relevant Medications   [name];[name]     Digestive   Acid reflux    Managed by [name]. Continues dexilant daily.       Adaptive colitis    Managed by GI.        Other Visit Diagnoses    Urinary frequency    -  Primary    Will cutlure urine given urinary frequency and  suprapubic tenderness. Likely 2/2 celexa and IBS symptoms. Avoid bladder irritants. Drink plenty of water. Alarm     Relevant Orders    POCT urinalysis dipstick (Completed)    CULTURE, URINE COMPREHENSIVE    Mild hyperlipidemia        Check baseline cholesterol today.     Relevant Orders    Lipid panel       Meds ordered this encounter  Medications  . [name];[name]    Sig: Inhale 1-2 puffs into the lungs every 6 (six) hours as needed for wheezing or shortness of breath. Or cough    Dispense:  1 Inhaler    Refill:  11    Order Specific Question:  Supervising Provider    Answer:  Janeann Forehand [[id number]]      Follow up plan: Return in about 1 year (around 08/30/2016), or if symptoms worsen or fail to improve.

## 2015-08-31 NOTE — Assessment & Plan Note (Signed)
Managed by [name]. Continues dexilant daily.

## 2015-09-01 LAB — LIPID PANEL
CHOL/HDL RATIO: 3.4 ratio (ref <=5.0)
Cholesterol: 169 mg/dL (ref 125–200)
HDL: 49 mg/dL (ref >=46)
LDL CALC: 107 mg/dL (ref <130)
TRIGLYCERIDES: 64 mg/dL (ref <150)
VLDL: 13 mg/dL (ref <30)

## 2015-09-03 LAB — CULTURE, URINE COMPREHENSIVE: Colony Count: 15000

## 2015-09-04 ENCOUNTER — Other Ambulatory Visit: Payer: Self-pay | Admitting: Family Medicine

## 2015-09-04 DIAGNOSIS — N3000 Acute cystitis without hematuria: Secondary | ICD-10-CM

## 2015-09-04 MED ORDER — NITROFURANTOIN MONOHYD MACRO 100 MG PO CAPS
100.0000 mg | ORAL_CAPSULE | Freq: Two times a day (BID) | ORAL | Status: DC
Start: 2015-09-04 — End: 2016-04-05

## 2015-09-11 ENCOUNTER — Ambulatory Visit: Payer: 59 | Admitting: Gastroenterology

## 2015-09-26 ENCOUNTER — Ambulatory Visit: Payer: 59 | Admitting: Gastroenterology

## 2015-10-07 NOTE — ED Provider Notes (Signed)
CSN: [id number]     Arrival date & time 08/16/15  1131 History   First MD Initiated Contact with Patient 08/16/15 1244     Chief Complaint  Patient presents with  . Abdominal Pain   (Consider location/radiation/quality/duration/timing/severity/associated sxs/prior Treatment) Patient is a 25 y.o. female presenting with abdominal pain. The history is provided by the patient.  Abdominal Pain Pain location:  Generalized Pain quality: aching   Pain radiates to:  Does not radiate Pain severity:  Mild Onset quality:  Gradual Timing:  Intermittent Progression:  Unchanged Chronicity:  Recurrent Context: not alcohol use, not awakening from sleep, not diet changes, not eating, not laxative use, not medication withdrawal, not previous surgeries, not recent illness, not recent sexual activity, not recent travel, not retching, not sick contacts, not suspicious food intake and not trauma   Relieved by:  Nothing Associated symptoms: nausea   Associated symptoms: no anorexia, no belching, no chest pain, no chills, no constipation, no cough, no diarrhea, no dysuria, no fatigue, no fever, no flatus, no hematemesis, no hematochezia, no hematuria, no shortness of breath, no sore throat, no vaginal bleeding, no vaginal discharge and no vomiting   Risk factors: no alcohol abuse, no aspirin use, not elderly, has not had multiple surgeries, no NSAID use, not pregnant and no recent hospitalization     Past Medical History  Diagnosis Date  . IBS (irritable bowel syndrome)   . Asthma   . Sinusitis   . IgA deficiency, selective (HCC)   . Colitis   . GERD (gastroesophageal reflux disease)   . Allergy    Past Surgical History  Procedure Laterality Date  . Ovarian cyst removal Right     2007  . [facility]     Family History  Problem Relation Age of Onset  . Cancer Father     skin   Social History  Substance Use Topics  . Smoking status: Never Smoker   . Smokeless tobacco: Never Used  . Alcohol  Use: No   OB History    Gravida Para Term Preterm AB TAB SAB Ectopic Multiple Living       Review of Systems  Constitutional: Negative for fever, chills and fatigue.  HENT: Negative for sore throat.   Respiratory: Negative for cough and shortness of breath.   Cardiovascular: Negative for chest pain.  Gastrointestinal: Positive for nausea and abdominal pain. Negative for vomiting, diarrhea, constipation, hematochezia, anorexia, flatus and hematemesis.  Genitourinary: Negative for dysuria, hematuria, vaginal bleeding and vaginal discharge.    Allergies  Biaxin and Cefzil  Home Medications   [name]   Medication Sig Start Date End Date Taking? Authorizing Provider  aspirin-acetaminophen-caffeine (EXCEDRIN MIGRAINE) [phone] MG per tablet Take 2 tablets by mouth every 8 (eight) hours as needed for migraine.    Yes Historical Provider, MD  [name] 1-20 MG-MCG tablet Take 1 tablet by mouth daily. 10/13/14  Yes Melody N Shambley, CNM  [name] Take 5 mg by mouth 4 (four) times daily.   Yes Historical Provider, MD  Multiple Vitamin (MULTIVITAMIN) tablet Take 1 tablet by mouth daily.   Yes Historical Provider, MD  [name] Take 1 tablet (4 mg total) by mouth every 8 (eight) hours as needed for nausea or vomiting. 10/20/14  Yes [name], MD  [name] Take 1 tablet (25 mg total) by mouth every  6 (six) hours as needed for nausea or vomiting. 11/09/14  Yes [name], MD  [name];[name] Inhale 1-2 puffs into the lungs every 6 (six) hours as needed for wheezing or shortness of breath. Or cough 08/31/15   Amy Rusty Aus, NP  citalopram (CELEXA) 10 MG tablet Take 1 tablet (10 mg total) by mouth daily. 08/17/15   [name], MD  dexlansoprazole ([name]) 60 MG capsule Take 1 capsule (60 mg total) by  mouth daily. 08/23/15   [name], MD  [name] Take 1 capsule (10 mg total) by mouth 4 (four) times daily -  before meals and at bedtime. 08/17/15   [name], MD  docusate sodium (COLACE) 100 MG capsule Take by mouth. Reported on 05/18/2015    Historical Provider, MD  [name] Take 180 mg by mouth daily.    Historical Provider, MD  fluticasone (FLONASE) 50 MCG/ACT nasal spray Place 2 sprays into both nostrils daily. 08/10/14   Rae Halsted, PA-C  [name] Place 1 suppository (25 mg total) rectally 2 (two) times daily. 11/09/14   [name], MD  Lidocaine, [name], [name] Relief of anorectal pain and itching: Apply small, pea-sized amount of cream to affected area up to 6 times daily as needed 03/07/15   Danelle Berry, PA-C  nitrofurantoin, macrocrystal-monohydrate, (MACROBID) 100 MG capsule Take 1 capsule (100 mg total) by mouth 2 (two) times daily. 09/04/15   Amy Rusty Aus, NP  [name] Place 1 application rectally 2 (two) times daily as needed for hemorrhoids. 03/07/15   Danelle Berry, PA-C   Meds Ordered and Administered this Visit   Medications  gi cocktail (Maalox,Lidocaine,Donnatal) (30 mLs Oral Given 08/16/15 1308)  ondansetron (ZOFRAN-ODT) disintegrating tablet 8 mg (8 mg Oral Given 08/16/15 1309)    BP 117/72 mmHg  Pulse 102  Temp(Src) 98.6 F (37 C) (Oral)  Resp 16  Ht 5\' 5"  (1.651 m)  Wt 138 lb (62.596 kg)  BMI 22.96 kg/m2  SpO2 100% No data found.   Physical Exam  Constitutional: She appears well-developed.  Cardiovascular: Normal rate, regular rhythm, normal heart sounds and intact distal pulses.   No murmur heard. Pulmonary/Chest: Effort normal and breath sounds normal. No respiratory distress. She has no wheezes. She has no rales.  Abdominal: Soft. Bowel sounds are normal. She exhibits no  distension and no mass. There is tenderness (mild, diffuse; no rebound or guarding; no masses). There is no rebound and no guarding.  Neurological: She is alert.  Skin: Skin is warm and dry. No rash noted. She is not diaphoretic. No erythema.  Nursing note and vitals reviewed.   ED Course  Procedures (including critical care time)  Labs Review Labs Reviewed  COMPREHENSIVE METABOLIC PANEL - Abnormal; Notable for the following:    Calcium 8.7 (*)    All other components within normal limits  CBC WITH DIFFERENTIAL/PLATELET    Imaging Review No results found.   Visual Acuity Review  Right Eye Distance:   Left Eye Distance:   Bilateral Distance:    Right Eye Near:   Left Eye Near:    Bilateral Near:         MDM   1. Generalized abdominal pain    Discharge Medication List as of 08/16/2015  2:14 PM      1. Lab results (normal/negative) and diagnosis  reviewed with patient 2. rx as per orders above; reviewed possible side effects, interactions, risks and benefits  3. Recommend supportive treatment with  4. Follow-up with pcp or sooner prn if symptoms worsen or don't improve  Payton Mccallum, MD 10/07/15 1708

## 2015-10-17 ENCOUNTER — Other Ambulatory Visit: Payer: Self-pay | Admitting: *Deleted

## 2015-10-17 ENCOUNTER — Telehealth: Payer: Self-pay | Admitting: Obstetrics and Gynecology

## 2015-10-17 MED ORDER — JUNEL FE 1/20 1-20 MG-MCG PO TABS: 1.0000 | ORAL_TABLET | Freq: Every day | ORAL | 0 refills | Status: DC

## 2015-10-17 NOTE — Telephone Encounter (Signed)
Done-ac 

## 2015-10-17 NOTE — Telephone Encounter (Signed)
Pt had to move her AE out a little Its 9/5 and she will need her [facility] refilled before then and she is out of refills. Will you send 1 month until she comes for her AE

## 2015-10-18 ENCOUNTER — Encounter: Payer: 59 | Admitting: Obstetrics and Gynecology

## 2015-11-21 ENCOUNTER — Encounter: Payer: 59 | Admitting: Obstetrics and Gynecology

## 2015-11-24 ENCOUNTER — Other Ambulatory Visit: Payer: Self-pay | Admitting: Gastroenterology

## 2015-11-24 DIAGNOSIS — R110 Nausea: Secondary | ICD-10-CM

## 2015-11-28 ENCOUNTER — Telehealth: Payer: Self-pay | Admitting: Obstetrics and Gynecology

## 2015-11-28 ENCOUNTER — Other Ambulatory Visit: Payer: Self-pay | Admitting: *Deleted

## 2015-11-28 MED ORDER — JUNEL FE 1/20 1-20 MG-MCG PO TABS: 1.0000 | ORAL_TABLET | Freq: Every day | ORAL | 0 refills | Status: DC

## 2015-11-28 NOTE — Telephone Encounter (Signed)
[facility]. That's how far out I had to reschedule her AE  [facility])

## 2015-11-28 NOTE — Telephone Encounter (Signed)
Done-ac 

## 2015-12-25 ENCOUNTER — Other Ambulatory Visit: Payer: Self-pay | Admitting: Gastroenterology

## 2016-01-05 ENCOUNTER — Encounter: Payer: Self-pay | Admitting: Emergency Medicine

## 2016-01-05 ENCOUNTER — Ambulatory Visit
Admission: EM | Admit: 2016-01-05 | Discharge: 2016-01-05 | Disposition: A | Discharge status: Home / Self Care | Payer: 59 | Attending: Family Medicine | Admitting: Family Medicine

## 2016-01-05 DIAGNOSIS — J0100 Acute maxillary sinusitis, unspecified: Secondary | ICD-10-CM | POA: Diagnosis not present

## 2016-01-05 LAB — RAPID STREP SCREEN (MED CTR MEBANE ONLY): STREPTOCOCCUS, GROUP A SCREEN (DIRECT): NEGATIVE

## 2016-01-05 MED ORDER — AMOXICILLIN-POT CLAVULANATE 875-125 MG PO TABS: 1.0000 | ORAL_TABLET | Freq: Two times a day (BID) | ORAL | 0 refills | Status: DC

## 2016-01-05 NOTE — ED Triage Notes (Signed)
Patient c/o sinus pain and pressure, and HAs for a month.  Patient c/o sore throat a month ago also.

## 2016-01-05 NOTE — ED Provider Notes (Signed)
[name]    CSN: [id number] Arrival date & time: 01/05/16  0848     History   Chief Complaint Chief Complaint  Patient presents with  . Sore Throat  . Facial Pain    HPI Carol Conley is a 25 y.o. female.   The history is provided by the patient.  URI  Presenting symptoms: congestion, cough, facial pain and fatigue   Onset quality:  Sudden Duration:  4 weeks Timing:  Constant Progression:  Worsening Chronicity:  New Relieved by:  Nothing Ineffective treatments:  OTC medications Associated symptoms: headaches and sinus pain   Associated symptoms: no myalgias, no neck pain, no sneezing, no swollen glands and no wheezing   Risk factors: not elderly, no chronic cardiac disease, no chronic kidney disease, no chronic respiratory disease, no diabetes mellitus, no immunosuppression, no recent illness, no recent travel and no sick contacts     Past Medical History:  Diagnosis Date  . Allergy   . Asthma   . Colitis   . GERD (gastroesophageal reflux disease)   . IBS (irritable bowel syndrome)   . IgA deficiency, selective (HCC)   . Sinusitis     Patient Active Problem List   Diagnosis Date Noted  . Asthma, mild intermittent 08/31/2015  . Abdominal pain 10/20/2014  . Abdominal pain, epigastric 10/20/2014  . 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 Feeling bilious 10/20/2014  . Acid reflux 10/20/2014  . Hemorrhoids, internal 10/20/2014  . Hemorrhoid 10/20/2014  . H/O gastrointestinal disease 10/20/2014  . 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 IgA deficiency, isolated (HCC) 10/20/2014  . Colitis 03/08/2014    Past Surgical History:  Procedure Laterality Date  . OVARIAN CYST REMOVAL Right    2007  . [facility]      OB History    Gravida Para Term Preterm AB Living   0 0 0 0 0 0   SAB TAB Ectopic Multiple Live Births   0 0 0 0         Home Medications    [name]   Medication Sig Start Date End Date Taking?  Authorizing Provider  [name];[name] Inhale 1-2 puffs into the lungs every 6 (six) hours as needed for wheezing or shortness of breath. Or cough 08/31/15   Amy Rusty Aus, NP  amoxicillin-clavulanate (AUGMENTIN) 875-125 MG tablet Take 1 tablet by mouth 2 (two) times daily. 01/05/16   Payton Mccallum, MD  aspirin-acetaminophen-caffeine (EXCEDRIN MIGRAINE) [phone] MG per tablet Take 2 tablets by mouth every 8 (eight) hours as needed for migraine.     Historical Provider, MD  citalopram (CELEXA) 10 MG tablet Take 1 tablet (10 mg total) by mouth daily. 08/17/15   [name], MD  dexlansoprazole ([name]) 60 MG capsule Take 1 capsule (60 mg total) by mouth daily. 08/23/15   [name], MD  [name] TAKE 1 CAPSULE BY MOUTH 4 TIMES DAILY - BEFORE MEALS AND AT BEDTIME. 12/25/15   [name], MD  docusate sodium (COLACE) 100 MG capsule Take by mouth. Reported on 05/18/2015    Historical Provider, MD  [name] Take 180 mg by mouth daily.    Historical Provider, MD  fluticasone (FLONASE) 50 MCG/ACT nasal spray Place 2 sprays into both nostrils daily. 08/10/14   Rae Halsted, PA-C  [name] Place 1 suppository (25 mg total) rectally 2 (two) times daily. 11/09/14   Darren  Servando Snare, MD  [name] 1-20 MG-MCG tablet Take 1 tablet by mouth daily. 11/28/15   Melody N Shambley, CNM  Lidocaine, [name], [name] Relief of anorectal pain and itching: Apply small, pea-sized amount of cream to affected area up to 6 times daily as needed 03/07/15   Danelle Berry, PA-C  [name] Take 5 mg by mouth 4 (four) times daily.    Historical Provider, MD  Multiple Vitamin (MULTIVITAMIN) tablet Take 1 tablet by mouth daily.    Historical Provider, MD  nitrofurantoin, macrocrystal-monohydrate, (MACROBID) 100 MG capsule Take 1 capsule (100 mg total) by mouth 2 (two) times  daily. 09/04/15   Amy Lauren Krebs, NP  [name] TAKE 1 TABLET BY MOUTH EVERY 8 HOURS AS NEEDED FOR NAUSEA OR VOMITING. 11/24/15   [name], MD  [name] Place 1 application rectally 2 (two) times daily as needed for hemorrhoids. 03/07/15   Danelle Berry, PA-C  [name] Take 1 tablet (25 mg total) by mouth every 6 (six) hours as needed for nausea or vomiting. 11/09/14   [name], MD    Family History Family History  Problem Relation Age of Onset  . Cancer Father     skin    Social History Social History  Substance Use Topics  . Smoking status: Never Smoker  . Smokeless tobacco: Never Used  . [name]     Allergies   [name] and [name]   Review of Systems Review of Systems  Constitutional: Positive for fatigue.  HENT: Positive for congestion. Negative for sneezing.   Respiratory: Positive for cough. Negative for wheezing.   Musculoskeletal: Negative for myalgias and neck pain.  Neurological: Positive for headaches.     Physical Exam Triage Vital Signs ED Triage Vitals  Enc Vitals Group     BP 01/05/16 0926 107/68     Pulse Rate 01/05/16 0926 93     Resp 01/05/16 0926 16     Temp 01/05/16 0926 97.4 F (36.3 C)     Temp Source 01/05/16 0926 Tympanic     SpO2 01/05/16 0926 100 %     Weight 01/05/16 0924 135 lb (61.2 kg)     Height 01/05/16 0924 5\' 5"  (1.651 m)     Head Circumference --      Peak Flow --      Pain Score 01/05/16 0925 5     Pain Loc --      Pain Edu? --      Excl. in GC? --    No data found.   Updated Vital Signs BP 107/68 (BP Location: Left Arm)   Pulse 93   Temp 97.4 F (36.3 C) (Tympanic)   Resp 16   Ht 5\' 5"  (1.651 m)   Wt 135 lb (61.2 kg)   SpO2 100%   BMI 22.47 kg/m   Visual Acuity Right Eye Distance:   Left Eye Distance:     Bilateral Distance:    Right Eye Near:   Left Eye Near:    Bilateral Near:     Physical Exam  Constitutional: She appears well-developed and well-nourished. No distress.  HENT:  Head: Normocephalic and atraumatic.  Right Ear: Tympanic membrane, external ear and ear canal normal.  Left Ear: Tympanic membrane, external ear and ear canal normal.  Nose: Mucosal edema and rhinorrhea present. No nose lacerations, sinus tenderness,  nasal deformity, septal deviation or nasal septal hematoma. No epistaxis.  No foreign bodies. Right sinus exhibits maxillary sinus tenderness and frontal sinus tenderness. Left sinus exhibits maxillary sinus tenderness and frontal sinus tenderness.  Mouth/Throat: Uvula is midline, oropharynx is clear and moist and mucous membranes are normal. No oropharyngeal exudate.  Eyes: Conjunctivae and EOM are normal. Pupils are equal, round, and reactive to light. Right eye exhibits no discharge. Left eye exhibits no discharge. No scleral icterus.  Neck: Normal range of motion. Neck supple. No thyromegaly present.  Cardiovascular: Normal rate, regular rhythm and normal heart sounds.   Pulmonary/Chest: Effort normal and breath sounds normal. No respiratory distress. She has no wheezes. She has no rales.  Lymphadenopathy:    She has no cervical adenopathy.  Skin: She is not diaphoretic.  Nursing note and vitals reviewed.    UC Treatments / Results  Labs (all labs ordered are listed, but only abnormal results are displayed) Labs Reviewed  RAPID STREP SCREEN (NOT AT [facility])  CULTURE, GROUP A STREP [facility])    EKG  EKG Interpretation None       Radiology No results found.  Procedures Procedures (including critical care time)  Medications Ordered in UC Medications - No data to display   Initial Impression / Assessment and Plan / UC Course  I have reviewed the triage vital signs and the nursing notes.  Pertinent labs & imaging results that were available during  my care of the patient were reviewed by me and considered in my medical decision making (see chart for details).  Clinical Course     Final Clinical Impressions(s) / UC Diagnoses   Final diagnoses:  Acute maxillary sinusitis, recurrence not specified    New Prescriptions Discharge Medication List as of 01/05/2016  9:49 AM    START taking these medications   Details  amoxicillin-clavulanate (AUGMENTIN) 875-125 MG tablet Take 1 tablet by mouth 2 (two) times daily., Starting Fri 01/05/2016, Normal       1. diagnosis reviewed with patient 2. rx as per orders above; reviewed possible side effects, interactions, risks and benefits  3. Follow-up prn if symptoms worsen or don't improve   Payton Mccallumrlando Tijuana Scheidegger, MD 01/05/16 1011

## 2016-01-07 LAB — CULTURE, GROUP A STREP (THRC)

## 2016-02-29 ENCOUNTER — Ambulatory Visit (INDEPENDENT_AMBULATORY_CARE_PROVIDER_SITE_OTHER): Payer: 59 | Admitting: Obstetrics and Gynecology

## 2016-02-29 ENCOUNTER — Encounter: Payer: Self-pay | Admitting: Obstetrics and Gynecology

## 2016-02-29 VITALS — BP 99/78 | HR 100 | Ht 65.0 in | Wt 140.8 lb

## 2016-02-29 DIAGNOSIS — R5383 Other fatigue: Secondary | ICD-10-CM | POA: Diagnosis not present

## 2016-02-29 DIAGNOSIS — Z01419 Encounter for gynecological examination (general) (routine) without abnormal findings: Secondary | ICD-10-CM

## 2016-02-29 MED ORDER — JUNEL FE 1/20 1-20 MG-MCG PO TABS: 1.0000 | ORAL_TABLET | Freq: Every day | ORAL | 4 refills | Status: DC

## 2016-02-29 NOTE — Progress Notes (Signed)
   Subjective:     Carol Conley is a 25 y.o. female and is here for a comprehensive physical exam. The patient reports daily fatigue. Reports frequent wakening through out the night. Had flu vaccine at work.   Social History   Social History  . [name]    [name]  . [name]  . [name]   Occupational History  . Not on file.   Social History Main Topics  . Smoking status: Never Smoker  . Smokeless tobacco: Never Used  . [name]  . [name]  . [name]   Other Topics Concern  . Not on file   Social History Narrative  . No narrative on file   Health Maintenance  Topic Date Due  . HIV Screening  04/16/2003  . INFLUENZA VACCINE  10/17/2015  . PAP SMEAR  08/31/2018  . TETANUS/TDAP  03/18/2022    The following portions of the patient's history were reviewed and updated as appropriate: allergies, current medications, past family history, past medical history, past social history, past surgical history and problem list.  Review of Systems Pertinent items noted in HPI and remainder of comprehensive ROS otherwise negative.   Objective:    General appearance: alert, cooperative and appears stated age Neck: no adenopathy, no carotid bruit, no JVD, supple, symmetrical, trachea midline and thyroid not enlarged, symmetric, no tenderness/mass/nodules Lungs: clear to auscultation bilaterally Breasts: normal appearance, no masses or tenderness Heart: regular rate and rhythm, S1, S2 normal, no murmur, click, rub or gallop Abdomen: soft, non-tender; bowel sounds normal; no masses,  no organomegaly    Assessment:    Healthy female exam. IBS; Fatigue     Plan:    labs obtained, pap declined as never been sexually active RTC 1 year or as needed.  Alleya Demeter Aura CampsShambley, CNM See After Visit Summary for Counseling Recommendations

## 2016-03-01 LAB — THYROID PANEL WITH TSH
Free Thyroxine Index: 2.1 (ref 1.2–4.9)
T3 UPTAKE RATIO: 22 % — AB (ref 24–39)
T4 TOTAL: 9.6 ug/dL (ref 4.5–12.0)
TSH: 1.34 u[IU]/mL (ref 0.450–4.500)

## 2016-03-01 LAB — COMPREHENSIVE METABOLIC PANEL
ALK PHOS: 70 IU/L (ref 39–117)
ALT: 9 IU/L (ref 0–32)
AST: 15 IU/L (ref 0–40)
Albumin/Globulin Ratio: 1.2 (ref 1.2–2.2)
Albumin: 3.9 g/dL (ref 3.5–5.5)
BILIRUBIN TOTAL: 0.3 mg/dL (ref 0.0–1.2)
BUN/Creatinine Ratio: 14 (ref 9–23)
BUN: 11 mg/dL (ref 6–20)
CHLORIDE: 103 mmol/L (ref 96–106)
CO2: 24 mmol/L (ref 18–29)
Calcium: 9 mg/dL (ref 8.7–10.2)
Creatinine, Ser: 0.77 mg/dL (ref 0.57–1.00)
GFR calc Af Amer: 122 mL/min/{1.73_m2} (ref >59)
GFR calc non Af Amer: 106 mL/min/{1.73_m2} (ref >59)
GLUCOSE: 77 mg/dL (ref 65–99)
Globulin, Total: 3.3 g/dL (ref 1.5–4.5)
POTASSIUM: 4.4 mmol/L (ref 3.5–5.2)
Sodium: 141 mmol/L (ref 134–144)
Total Protein: 7.2 g/dL (ref 6.0–8.5)

## 2016-03-01 LAB — VITAMIN D 25 HYDROXY (VIT D DEFICIENCY, FRACTURES): Vit D, 25-Hydroxy: 30.2 ng/mL (ref 30.0–100.0)

## 2016-03-01 LAB — CBC
Hematocrit: 38.3 % (ref 34.0–46.6)
Hemoglobin: 12.8 g/dL (ref 11.1–15.9)
MCH: 27.3 pg (ref 26.6–33.0)
MCHC: 33.4 g/dL (ref 31.5–35.7)
MCV: 82 fL (ref 79–97)
PLATELETS: 311 10*3/uL (ref 150–379)
RBC: 4.69 x10E6/uL (ref 3.77–5.28)
RDW: 13.3 % (ref 12.3–15.4)
WBC: 5.9 10*3/uL (ref 3.4–10.8)

## 2016-03-01 LAB — VITAMIN B12: VITAMIN B 12: 308 pg/mL (ref 232–1245)

## 2016-03-01 LAB — MAGNESIUM: MAGNESIUM: 2.2 mg/dL (ref 1.6–2.3)

## 2016-03-07 ENCOUNTER — Other Ambulatory Visit: Payer: Self-pay | Admitting: Gastroenterology

## 2016-03-30 ENCOUNTER — Encounter (HOSPITAL_COMMUNITY): Payer: Self-pay | Admitting: Family Medicine

## 2016-03-30 ENCOUNTER — Ambulatory Visit (HOSPITAL_COMMUNITY)
Admission: EM | Admit: 2016-03-30 | Discharge: 2016-03-30 | Disposition: A | Discharge status: Home / Self Care | Payer: 59 | Attending: Internal Medicine | Admitting: Internal Medicine

## 2016-03-30 DIAGNOSIS — K219 Gastro-esophageal reflux disease without esophagitis: Secondary | ICD-10-CM | POA: Diagnosis not present

## 2016-03-30 DIAGNOSIS — K449 Diaphragmatic hernia without obstruction or gangrene: Secondary | ICD-10-CM | POA: Insufficient documentation

## 2016-03-30 DIAGNOSIS — J45909 Unspecified asthma, uncomplicated: Secondary | ICD-10-CM | POA: Insufficient documentation

## 2016-03-30 DIAGNOSIS — Z7982 Long term (current) use of aspirin: Secondary | ICD-10-CM | POA: Diagnosis not present

## 2016-03-30 DIAGNOSIS — R1013 Epigastric pain: Secondary | ICD-10-CM | POA: Diagnosis not present

## 2016-03-30 DIAGNOSIS — K589 Irritable bowel syndrome without diarrhea: Secondary | ICD-10-CM | POA: Insufficient documentation

## 2016-03-30 DIAGNOSIS — R109 Unspecified abdominal pain: Secondary | ICD-10-CM | POA: Diagnosis present

## 2016-03-30 LAB — CBC WITH DIFFERENTIAL/PLATELET
BASOS ABS: 0 10*3/uL (ref 0.0–0.1)
Basophils Relative: 0 %
Eosinophils Absolute: 0.1 10*3/uL (ref 0.0–0.7)
Eosinophils Relative: 1 %
HEMATOCRIT: 39.1 % (ref 36.0–46.0)
HEMOGLOBIN: 13.1 g/dL (ref 12.0–15.0)
LYMPHS ABS: 2.4 10*3/uL (ref 0.7–4.0)
LYMPHS PCT: 34 %
MCH: 27.3 pg (ref 26.0–34.0)
MCHC: 33.5 g/dL (ref 30.0–36.0)
MCV: 81.5 fL (ref 78.0–100.0)
Monocytes Absolute: 0.5 10*3/uL (ref 0.1–1.0)
Monocytes Relative: 7 %
NEUTROS ABS: 4.2 10*3/uL (ref 1.7–7.7)
Neutrophils Relative %: 58 %
Platelets: 272 10*3/uL (ref 150–400)
RBC: 4.8 MIL/uL (ref 3.87–5.11)
RDW: 12.7 % (ref 11.5–15.5)
WBC: 7.1 10*3/uL (ref 4.0–10.5)

## 2016-03-30 LAB — COMPREHENSIVE METABOLIC PANEL
ALT: 11 U/L — ABNORMAL LOW (ref 14–54)
ANION GAP: 11 (ref 5–15)
AST: 19 U/L (ref 15–41)
Albumin: 3.8 g/dL (ref 3.5–5.0)
Alkaline Phosphatase: 63 U/L (ref 38–126)
BUN: 8 mg/dL (ref 6–20)
CHLORIDE: 104 mmol/L (ref 101–111)
CO2: 24 mmol/L (ref 22–32)
Calcium: 9.8 mg/dL (ref 8.9–10.3)
Creatinine, Ser: 1.17 mg/dL — ABNORMAL HIGH (ref 0.44–1.00)
Glucose, Bld: 78 mg/dL (ref 65–99)
POTASSIUM: 3.5 mmol/L (ref 3.5–5.1)
Sodium: 139 mmol/L (ref 135–145)
Total Bilirubin: 0.3 mg/dL (ref 0.3–1.2)
Total Protein: 7.2 g/dL (ref 6.5–8.1)

## 2016-03-30 LAB — LIPASE, BLOOD: LIPASE: 18 U/L (ref 11–51)

## 2016-03-30 LAB — AMYLASE: Amylase: 46 U/L (ref 28–100)

## 2016-03-30 NOTE — Discharge Instructions (Signed)
I will call you tonight with lab results. Meanwhile, I would recommending scheduling an appt with [name] gastroenterologist for follow up.

## 2016-03-30 NOTE — ED Provider Notes (Signed)
CSN: [id number]     Arrival date & time 03/30/16  1746 History   First MD Initiated Contact with Patient 03/30/16 1950     Chief Complaint  Patient presents with  . 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 Patient has a gastroenterologist, has mentioned to the gastroenterologist that the current regimen for her GERD is not completely helpful, was told that this is a last resort. Patient has tried several other H2 blockers and PPI in the past without relief as well. Patient also endorses that she has hiatal hernia.   Patient is here today for an epigastric burning pain of 4 days duration accompany by nausea and burping up sour taste. Symptoms are intermittent. Reports the burning pain wakes her up at night sometimes. Patient also have been feeling nauseous without relief with Phenergan and Zofran. Patient reports that she usually don't drink but did drink some alcohol (2 mixed drinks) on New Year's Eve and last time she drank prior to this was over a year ago.      The history is provided by the patient.  Abdominal Pain  Pain location:  Epigastric Pain quality: burning   Pain radiation: radiates to left and to right. Pain severity:  Moderate Onset quality:  Gradual Duration:  5 days Timing:  Intermittent Progression:  Worsening Chronicity:  New Context: alcohol use and sick contacts   Context: not diet changes, not eating, not medication withdrawal and not recent illness     Past Medical History:  Diagnosis Date  . Allergy   . Asthma   . Colitis   . GERD (gastroesophageal reflux disease)   . IBS (irritable bowel syndrome)   . IgA deficiency, selective (HCC)   . Sinusitis    Past  Surgical History:  Procedure Laterality Date  . OVARIAN CYST REMOVAL Right    2007  . wisdom theeth     Family History  Problem Relation Age of Onset  . Cancer Father     skin   Social History  Substance Use Topics  . Smoking status: Never Smoker  . Smokeless tobacco: Never Used  . [name]   OB History    Gravida Para Term Preterm AB Living   0 0 0 0 0 0   SAB TAB Ectopic Multiple Live Births   0 0 0 0       Review of Systems  Constitutional:       As stated in the HPI  Gastrointestinal: Positive for abdominal pain.    Allergies  [name] and [name]  Home Medications   [name]   Medication Sig Start Date End Date Taking? Authorizing Provider  [name];[name] Inhale 1-2 puffs into the lungs every 6 (six) hours as needed for wheezing or shortness of breath. Or cough 08/31/15   Amy Rusty Aus, NP  amoxicillin-clavulanate (AUGMENTIN) 875-125 MG tablet Take 1 tablet by mouth 2 (two) times daily. Patient not taking: Reported on 02/29/2016 01/05/16   Payton Mccallum, MD  aspirin-acetaminophen-caffeine [facility] MIGRAINE) [id number] MG per tablet Take 2 tablets by mouth every 8 (eight) hours as needed for migraine.     Historical Provider, MD  citalopram (CELEXA) 10 MG tablet TAKE 1  TABLET BY MOUTH DAILY. 03/22/16   [name], MD  dexlansoprazole ([name]) 60 MG capsule Take 1 capsule (60 mg total) by mouth daily. 08/23/15   [name], MD  [name] TAKE 1 CAPSULE BY MOUTH 4 TIMES DAILY - BEFORE MEALS AND AT BEDTIME. 12/25/15   [name], MD  docusate sodium (COLACE) 100 MG capsule Take by mouth. Reported on 05/18/2015    Historical Provider, MD  [name] Take 180 mg by mouth daily.    Historical Provider, MD  fluticasone (FLONASE) 50 MCG/ACT nasal spray Place 2 sprays into both nostrils daily. 08/10/14   Rae Halsted,  PA-C  [name] Place 1 suppository (25 mg total) rectally 2 (two) times daily. Patient not taking: Reported on 02/29/2016 11/09/14   [name], MD  [name] 1-20 MG-MCG tablet Take 1 tablet by mouth daily. 02/29/16   Melody N Shambley, CNM  Lidocaine, [name], [name] Relief of anorectal pain and itching: Apply small, pea-sized amount of cream to affected area up to 6 times daily as needed Patient not taking: Reported on 02/29/2016 03/07/15   Danelle Berry, PA-C  [name] Take 5 mg by mouth 4 (four) times daily.    Historical Provider, MD  Multiple Vitamin (MULTIVITAMIN) tablet Take 1 tablet by mouth daily.    Historical Provider, MD  nitrofurantoin, macrocrystal-monohydrate, (MACROBID) 100 MG capsule Take 1 capsule (100 mg total) by mouth 2 (two) times daily. Patient not taking: Reported on 02/29/2016 09/04/15   Amy Lauren Krebs, NP  [name] TAKE 1 TABLET BY MOUTH EVERY 8 HOURS AS NEEDED FOR NAUSEA OR VOMITING. 11/24/15   [name], MD  [name] Place 1 application rectally 2 (two) times daily as needed for hemorrhoids. Patient not taking: Reported on 02/29/2016 03/07/15   Danelle Berry, PA-C  [name] Take 1 tablet (25 mg total) by mouth every 6 (six) hours as needed for nausea or vomiting. 11/09/14   [name], MD   Meds Ordered and Administered this Visit  Medications - No data to display  BP 128/87   Pulse 85   Temp 98.1 F (36.7 C)   Resp 18   SpO2 97%  No data found.   Physical Exam  Constitutional: She is oriented to person, place, and time. She appears well-developed and well-nourished.  Neck:  No lymphadenopathy   Cardiovascular: Normal rate, regular rhythm and normal heart sounds.   Pulmonary/Chest: Effort normal and breath sounds normal.   Abdominal: Soft. Bowel sounds are normal. She exhibits no mass. There is no guarding.  Tender to palpate in all four quadrants   Neurological: She is alert and oriented to person, place, and time.  Skin: Skin is warm and dry.  Nursing note and vitals reviewed.   Urgent Care Course   Clinical Course     Procedures (including critical care time)  Labs Review Labs Reviewed  COMPREHENSIVE METABOLIC PANEL - Abnormal; Notable for the following:       Result Value   Creatinine, Ser 1.17 (*)    ALT 11 (*)    All other components within normal limits  LIPASE, BLOOD  AMYLASE  CBC WITH DIFFERENTIAL/PLATELET    MDM   1. Epigastric pain    Patient has a history of GERD currently on Pepcid in the morning and [name] 60  mg at night, being followed by her gastroenterologist. Despite current regimen, patient continues to experience "breakthough moments" of reflux. In the past, patient have tried several other H2 blocker and PPI and found the current regimen to be the most helpful compared to. the others that she have tried previously. Patient also endorses that she has hiatal hernia.   The epigastric pain is describes as a burning pain that radiates to the left and right. Pain is most likely due to GERD but will obtain blood work to ensure that renal, liver and pancreatic enzymes are within normal limits as well as WBC count. Patient discharged home to wait for lab results as this could take several hours to come back.   Addendum: Lab results shows normal lipase and amylase, normal WBC count, noted slight elevation in creatinine level and ALT. Spoke with patient on the phone and informed her of the lab results. Assured her that the labs looks mostly unremarkable. Patient instructed to schedule an appointment to see her gastroenterologist and follow up, preferably within 1-2 weeks, for further management of her GERD. We discussed about hiatal hernia and patient is considering and will discuss the  option of hiatal hernia surgery to her gastroenterologist.      Lucia EstelleFeng Renia Mikelson, NP 03/31/16 [phone]

## 2016-03-30 NOTE — ED Triage Notes (Signed)
Pt here for epigastric pain, sensation of something stuck in esophagus for the past week. sts she has been using 2 different acid reflux meds and tums but no relief. sts started after drinking on new years eve and she hasnt drank in over a year.

## 2016-04-02 ENCOUNTER — Ambulatory Visit (INDEPENDENT_AMBULATORY_CARE_PROVIDER_SITE_OTHER): Payer: 59 | Admitting: Gastroenterology

## 2016-04-02 ENCOUNTER — Encounter: Payer: Self-pay | Admitting: Gastroenterology

## 2016-04-02 ENCOUNTER — Other Ambulatory Visit: Payer: Self-pay

## 2016-04-02 VITALS — BP 120/76 | HR 97 | Temp 99.0°F | Ht 65.0 in | Wt 136.5 lb

## 2016-04-02 DIAGNOSIS — R1310 Dysphagia, unspecified: Secondary | ICD-10-CM | POA: Diagnosis not present

## 2016-04-02 DIAGNOSIS — K219 Gastro-esophageal reflux disease without esophagitis: Secondary | ICD-10-CM

## 2016-04-02 DIAGNOSIS — R110 Nausea: Secondary | ICD-10-CM

## 2016-04-02 MED ORDER — PROMETHAZINE HCL 25 MG PO TABS: 25.0000 mg | ORAL_TABLET | Freq: Four times a day (QID) | ORAL | 6 refills | Status: DC | PRN

## 2016-04-02 NOTE — Progress Notes (Signed)
Primary Care Physician: Filbert Berthold, NP  Primary Gastroenterologist:  Dr. [name]  Chief Complaint  Patient presents with  . Gastroesophageal Reflux    HPI: Carol Conley is a 26 y.o. female who has a history of irritable bowel syndrome.  The patient states that she ate [place] food recently and has not been seen since.  The patient had been put on Bentyl and states that it made her symptoms much better.  She reports that the Celexa at night has not helped her symptoms.  The patient states she takes her [name] daily and sometimes has to take a H2 blocker and Tums for acid breakthrough.  The patient also reports that she is more constipated now but is much happier with constipation and diarrhea.  She did have a bowel movement yesterday.  She now reports that she has some dysphagia to solid foods and some epigastric discomfort with heartburn.  Current Outpatient Prescriptions  Medication Sig Dispense Refill  . [name];[name] Inhale 1-2 puffs into the lungs every 6 (six) hours as needed for wheezing or shortness of breath. Or cough 1 Inhaler 11  . aspirin-acetaminophen-caffeine (EXCEDRIN MIGRAINE) 250-250-65 MG per tablet Take 2 tablets by mouth every 8 (eight) hours as needed for migraine.     . citalopram (CELEXA) 10 MG tablet TAKE 1 TABLET BY MOUTH DAILY. 30 tablet 5  . dexlansoprazole ([name]) 60 MG capsule Take 1 capsule (60 mg total) by mouth daily. 30 capsule 11  . [name] TAKE 1 CAPSULE BY MOUTH 4 TIMES DAILY - BEFORE MEALS AND AT BEDTIME. 90 capsule 3  . [name] Take 180 mg by mouth daily.    . fluticasone (FLONASE) 50 MCG/ACT nasal spray Place 2 sprays into both nostrils daily. 16 g 2  . [name] 1-20 MG-MCG tablet Take 1 tablet by mouth daily. 4 Package 4  . [name] Take 5 mg by mouth 4 (four) times daily.    . Multiple Vitamin  (MULTIVITAMIN) tablet Take 1 tablet by mouth daily.    . [name] TAKE 1 TABLET BY MOUTH EVERY 8 HOURS AS NEEDED FOR NAUSEA OR VOMITING. 30 tablet 5  . [name] Take 1 tablet (25 mg total) by mouth every 6 (six) hours as needed for nausea or vomiting. 30 tablet 6  . amoxicillin-clavulanate (AUGMENTIN) 875-125 MG tablet Take 1 tablet by mouth 2 (two) times daily. (Patient not taking: Reported on 04/02/2016) 20 tablet 0  . docusate sodium (COLACE) 100 MG capsule Take by mouth. Reported on 05/18/2015    . [name] Place 1 suppository (25 mg total) rectally 2 (two) times daily. (Patient not taking: Reported on 04/02/2016) 14 suppository 2  . Lidocaine, [name], [name] Relief of anorectal pain and itching: Apply small, pea-sized amount of cream to affected area up to 6 times daily as needed (Patient not taking: Reported on 04/02/2016) 30 g 1  . nitrofurantoin, macrocrystal-monohydrate, (MACROBID) 100 MG capsule Take 1 capsule (100 mg total) by mouth 2 (two) times daily. (Patient not taking: Reported on 04/02/2016) 14 capsule 0  . [name] Place 1 application rectally 2 (two) times daily as needed for hemorrhoids. (Patient not taking: Reported on 04/02/2016) 30 g 0   No current facility-administered medications for this visit.  Allergies as of 04/02/2016 - Review Complete 04/02/2016  Allergen Reaction Noted  . [name] Hives 08/10/2014  . [name] Hives 08/10/2014    ROS:  General: Negative for anorexia, weight loss, fever, chills, fatigue, weakness. ENT: Negative for hoarseness, difficulty swallowing , nasal congestion. CV: Negative for chest pain, angina, palpitations, dyspnea on exertion, peripheral edema.  Respiratory: Negative for dyspnea at rest, dyspnea on exertion,  cough, sputum, wheezing.  GI: See history of present illness. GU:  Negative for dysuria, hematuria, urinary incontinence, urinary frequency, nocturnal urination.  Endo: Negative for unusual weight change.    Physical Examination:   BP 120/76   Pulse 97   Temp 99 F (37.2 C) (Oral)   Ht 5\' 5"  (1.651 m)   Wt 136 lb 8 oz (61.9 kg)   BMI 22.71 kg/m   General: Well-nourished, well-developed in no acute distress.  Eyes: No icterus. Conjunctivae pink. Mouth: Oropharyngeal mucosa moist and pink , no lesions erythema or exudate. Lungs: Clear to auscultation bilaterally. Non-labored. Heart: Regular rate and rhythm, no murmurs rubs or gallops.  Abdomen: Bowel sounds are normal, Positive epigastric tenderness, nondistended, no hepatosplenomegaly or masses, no abdominal bruits or hernia , no rebound or guarding.   Extremities: No lower extremity edema. No clubbing or deformities. Neuro: Alert and oriented x 3.  Grossly intact. Skin: Warm and dry, no jaundice.   Psych: Alert and cooperative, normal mood and affect.  Labs:    Imaging Studies: No results found.  Assessment and Plan:   Carol Conley is a 26 y.o. y/o female Who has a history of irritable bowel syndrome who had been doing well up until recently when she ate [place] food and has been in discomfort ever since.  The patient also reports that she has dysphagia.  The patient did increase her dicyclomine to 20 mg a day but started to get dizzy so she is now on 10 mg 4 times a day.  The patient states she is not doing better with the Celexa so her Celexa will be stopped.  The patient will increase her dicyclomine at bedtime to 20 mg since she will likely sleep through the dizziness.  She also has been given a refill of her Phenergan for her nausea that she has about twice a week.  She has also been given samples of Dexlilant so that she can double up on her [name] and see if that helps her acid breakthrough.  She will also be set up  for an EGD because of her dysphagia. I have discussed risks & benefits which include, but are not limited to, bleeding, infection, perforation & drug reaction.  The patient agrees with this plan & written consent will be obtained.       Midge Miniumarren Ewell Benassi, MD. Clementeen GrahamFACG   Note: This dictation was prepared with Dragon dictation along with smaller phrase technology. Any transcriptional errors that result from this process are unintentional.

## 2016-04-03 ENCOUNTER — Other Ambulatory Visit: Payer: Self-pay

## 2016-04-05 ENCOUNTER — Encounter: Payer: Self-pay | Admitting: *Deleted

## 2016-04-05 NOTE — Discharge Instructions (Signed)

## 2016-04-08 ENCOUNTER — Ambulatory Visit: Payer: 59 | Admitting: Anesthesiology

## 2016-04-08 ENCOUNTER — Ambulatory Visit
Admission: RE | Admit: 2016-04-08 | Discharge: 2016-04-08 | Disposition: A | Discharge status: Home / Self Care | Payer: 59 | Source: Ambulatory Visit | Attending: Gastroenterology | Admitting: Gastroenterology

## 2016-04-08 ENCOUNTER — Encounter
Admission: RE | Disposition: A | Discharge status: Home / Self Care | Payer: Self-pay | Source: Ambulatory Visit | Attending: Gastroenterology

## 2016-04-08 DIAGNOSIS — R112 Nausea with vomiting, unspecified: Secondary | ICD-10-CM | POA: Diagnosis not present

## 2016-04-08 DIAGNOSIS — Z79899 Other long term (current) drug therapy: Secondary | ICD-10-CM | POA: Insufficient documentation

## 2016-04-08 DIAGNOSIS — J45909 Unspecified asthma, uncomplicated: Secondary | ICD-10-CM | POA: Insufficient documentation

## 2016-04-08 DIAGNOSIS — Z7982 Long term (current) use of aspirin: Secondary | ICD-10-CM | POA: Insufficient documentation

## 2016-04-08 DIAGNOSIS — K219 Gastro-esophageal reflux disease without esophagitis: Secondary | ICD-10-CM | POA: Insufficient documentation

## 2016-04-08 DIAGNOSIS — K222 Esophageal obstruction: Secondary | ICD-10-CM | POA: Diagnosis not present

## 2016-04-08 DIAGNOSIS — R1310 Dysphagia, unspecified: Secondary | ICD-10-CM

## 2016-04-08 DIAGNOSIS — K589 Irritable bowel syndrome without diarrhea: Secondary | ICD-10-CM | POA: Insufficient documentation

## 2016-04-08 DIAGNOSIS — K449 Diaphragmatic hernia without obstruction or gangrene: Secondary | ICD-10-CM | POA: Diagnosis not present

## 2016-04-08 HISTORY — DX: Headache: R51

## 2016-04-08 HISTORY — PX: ESOPHAGOGASTRODUODENOSCOPY (EGD) WITH PROPOFOL: SHX5813

## 2016-04-08 HISTORY — DX: Headache, unspecified: R51.9

## 2016-04-08 HISTORY — PX: ESOPHAGEAL DILATION: SHX303

## 2016-04-08 HISTORY — DX: Motion sickness, initial encounter: T75.3XXA

## 2016-04-08 SURGERY — ESOPHAGOGASTRODUODENOSCOPY (EGD) WITH PROPOFOL
Anesthesia: Monitor Anesthesia Care | Wound class: Clean Contaminated

## 2016-04-08 MED ORDER — LACTATED RINGERS IV SOLN
INTRAVENOUS | Status: DC
  Administered 2016-04-08: 10:00:00 via INTRAVENOUS

## 2016-04-08 MED ORDER — PROPOFOL 10 MG/ML IV BOLUS
INTRAVENOUS | Status: DC | PRN
Start: 2016-04-08 — End: 2016-04-08
  Administered 2016-04-08: 50 mg via INTRAVENOUS
  Administered 2016-04-08: 100 mg via INTRAVENOUS
  Administered 2016-04-08: 50 mg via INTRAVENOUS

## 2016-04-08 MED ORDER — GLYCOPYRROLATE 0.2 MG/ML IJ SOLN
INTRAMUSCULAR | Status: DC | PRN
  Administered 2016-04-08: .2 mg via INTRAVENOUS

## 2016-04-08 MED ORDER — LIDOCAINE HCL (CARDIAC) 20 MG/ML IV SOLN
INTRAVENOUS | Status: DC | PRN
  Administered 2016-04-08: 40 mg via INTRAVENOUS

## 2016-04-08 MED ORDER — SIMETHICONE 40 MG/0.6ML PO SUSP
ORAL | Status: DC | PRN
  Administered 2016-04-08: 11:00:00

## 2016-04-08 SURGICAL SUPPLY — 32 items
BALLN DILATOR 10-12 8 (BALLOONS)
BALLN DILATOR 12-15 8 (BALLOONS)
BALLN DILATOR 15-18 8 (BALLOONS)
BALLN DILATOR CRE 0-12 8 (BALLOONS)
BALLN DILATOR ESOPH 8 10 CRE (MISCELLANEOUS) IMPLANT
BALLOON DILATOR 12-15 8 (BALLOONS) IMPLANT
BALLOON DILATOR 15-18 8 (BALLOONS) IMPLANT
BALLOON DILATOR CRE 0-12 8 (BALLOONS) IMPLANT
BLOCK BITE 60FR ADLT L/F GRN (MISCELLANEOUS) ×3 IMPLANT
CANISTER SUCT 1200ML W/VALVE (MISCELLANEOUS) ×3 IMPLANT
CLIP HMST 235XBRD CATH ROT (MISCELLANEOUS) IMPLANT
CLIP RESOLUTION 360 11X235 (MISCELLANEOUS)
FCP ESCP3.2XJMB 240X2.8X (MISCELLANEOUS)
FORCEPS BIOP RAD 4 LRG CAP 4 (CUTTING FORCEPS) ×3 IMPLANT
FORCEPS BIOP RJ4 240 W/NDL (MISCELLANEOUS)
FORCEPS ESCP3.2XJMB 240X2.8X (MISCELLANEOUS) IMPLANT
GOWN CVR UNV OPN BCK APRN NK (MISCELLANEOUS) ×4 IMPLANT
GOWN ISOL THUMB LOOP REG UNIV (MISCELLANEOUS) ×2
INJECTOR VARIJECT VIN23 (MISCELLANEOUS) IMPLANT
KIT DEFENDO VALVE AND CONN (KITS) IMPLANT
KIT ENDO PROCEDURE OLY (KITS) ×3 IMPLANT
MARKER SPOT ENDO TATTOO 5ML (MISCELLANEOUS) IMPLANT
PAD GROUND ADULT SPLIT (MISCELLANEOUS) IMPLANT
RETRIEVER NET PLAT FOOD (MISCELLANEOUS) IMPLANT
SNARE SHORT THROW 13M SML OVAL (MISCELLANEOUS) IMPLANT
SNARE SHORT THROW 30M LRG OVAL (MISCELLANEOUS) IMPLANT
SPOT EX ENDOSCOPIC TATTOO (MISCELLANEOUS)
SYR INFLATION 60ML (SYRINGE) IMPLANT
TRAP ETRAP POLY (MISCELLANEOUS) IMPLANT
VARIJECT INJECTOR VIN23 (MISCELLANEOUS)
WATER STERILE IRR 250ML POUR (IV SOLUTION) ×3 IMPLANT
WIRE CRE 18-20MM 8CM F G (MISCELLANEOUS) IMPLANT

## 2016-04-08 NOTE — H&P (Signed)
Midge Miniumarren Shelia Kingsberry, MD [facility] [address] [place], [name] [address] Phone: [phone] Fax : [phone]  Primary Care Physician:  [name], NP Primary Gastroenterologist:  [name]  [name] HPI:  Carol Conley is a 26 y.o. female is here for an endoscopy.   Past Medical History:  Diagnosis Date  . Allergy   . Asthma   . Colitis   . GERD (gastroesophageal reflux disease)   . Headache    sinus  . IBS (irritable bowel syndrome)   . IgA deficiency, selective (HCC)   . Motion sickness    cars, planes  . Sinusitis     Past Surgical History:  Procedure Laterality Date  . OVARIAN CYST REMOVAL Right    2007  . wisdom theeth      [name]   Medication Sig Start Date End Date Taking? Authorizing Provider  [name];[name] Inhale 1-2 puffs into the lungs every 6 (six) hours as needed for wheezing or shortness of breath. Or cough 08/31/15  Yes Amy Rusty AusLauren Krebs, NP  aspirin-acetaminophen-caffeine (EXCEDRIN MIGRAINE) 250-250-65 MG per tablet Take 2 tablets by mouth every 8 (eight) hours as needed for migraine.    Yes Historical Provider, MD  dexlansoprazole ([name]) 60 MG capsule Take 1 capsule (60 mg total) by mouth daily. 08/23/15  Yes Midge Miniumarren Payeton Germani, MD  [name] TAKE 1 CAPSULE BY MOUTH 4 TIMES DAILY - BEFORE MEALS AND AT BEDTIME. 12/25/15  Yes Midge Miniumarren Wyeth Hoffer, MD  [name] Take 180 mg by mouth daily. As needed   Yes Historical Provider, MD  fluticasone (FLONASE) 50 MCG/ACT nasal spray Place 2 sprays into both nostrils daily. Patient taking differently: Place 2 sprays into both nostrils daily. As needed 08/10/14  Yes Rae HalstedLaurie W Lee, PA-C  [name] Place 1 suppository (25 mg total) rectally 2 (two) times daily. Patient taking differently: Place 25 mg rectally 2 (two) times daily as needed.   11/09/14  Yes Midge Miniumarren Acheron Sugg, MD  [name] 1-20 MG-MCG tablet Take 1 tablet by mouth daily. 02/29/16  Yes Melody N Shambley, CNM  [name] Take 5 mg by mouth 4 (four) times daily.   Yes Historical Provider, MD  [name] TAKE 1 TABLET BY MOUTH EVERY 8 HOURS AS NEEDED FOR NAUSEA OR VOMITING. 11/24/15  Yes Midge Miniumarren Arleen Bar, MD  [name] Place 1 application rectally 2 (two) times daily as needed for hemorrhoids. 03/07/15  Yes Danelle BerryLeisa Tapia, PA-C  [name] Take by mouth.   Yes Historical Provider, MD  [name] Take 1 tablet (25 mg total) by mouth every 6 (six) hours as needed for nausea or vomiting. 04/02/16  Yes Midge Miniumarren Angie Piercey, MD  Lidocaine, [name], [name] Relief of anorectal pain and itching: Apply small, pea-sized amount of cream to affected area up to 6 times daily as needed Patient not taking: Reported on 04/02/2016 03/07/15   Danelle BerryLeisa Tapia, PA-C    Allergies as of 04/03/2016 - Review Complete 04/02/2016  Allergen Reaction Noted  . [name] Hives 08/10/2014  . [name] Hives 08/10/2014    Family History  Problem Relation Age of Onset  . Cancer Father     skin    Social History   Social History  . Marital  status: Single    [name]  . [name]  . [name]   Occupational History  . Not on file.   Social History Main Topics  . Smoking status: Never Smoker  . Smokeless tobacco: Never Used  . [name]  . [name]  . [name]   Other Topics Concern  . Not on file   Social History Narrative  . No narrative on file    [name], [name]  Physical Exam: BP 99/70   Pulse 83   Temp 97.5 F (36.4 C) (Temporal)   Resp 16   Ht 5\' 5"  (1.651 m)   Wt  139 lb (63 kg)   SpO2 100%   BMI 23.13 kg/m  General:   Alert,  pleasant and cooperative in NAD Head:  Normocephalic and atraumatic. Neck:  Supple; no masses or thyromegaly. Lungs:  Clear throughout to auscultation.    Heart:  Regular rate and rhythm. Abdomen:  Soft, nontender and nondistended. Normal bowel sounds, without guarding, and without rebound.   Neurologic:  Alert and  oriented x4;  grossly normal neurologically.  Impression/Plan: Carol Conley is here for an endoscopy to be performed for dysphagia  Risks, benefits, limitations, and alternatives regarding  endoscopy have been reviewed with the patient.  Questions have been answered.  All parties agreeable.   [name], MD  04/08/2016, 10:12 AM

## 2016-04-08 NOTE — Anesthesia Preprocedure Evaluation (Signed)
Anesthesia Evaluation  Patient identified by MRN, date of birth, ID band Patient awake    Reviewed: Allergy & Precautions, H&P , NPO status , Patient's Chart, lab work & pertinent test results, reviewed documented beta blocker date and time   Airway Mallampati: II  TM Distance: >3 FB Neck ROM: full    Dental no notable dental hx.    Pulmonary asthma ,    Pulmonary exam normal breath sounds clear to auscultation       Cardiovascular Exercise Tolerance: Good negative cardio ROS   Rhythm:regular Rate:Normal     Neuro/Psych  Headaches, negative psych ROS   GI/Hepatic negative GI ROS, Neg liver ROS, GERD  ,IBS   Endo/Other  negative endocrine ROS  Renal/GU negative Renal ROS  negative genitourinary   Musculoskeletal   Abdominal   Peds  Hematology negative hematology ROS (+)   Anesthesia Other Findings   Reproductive/Obstetrics negative OB ROS                             Anesthesia Physical Anesthesia Plan  ASA: II  Anesthesia Plan: MAC   Post-op Pain Management:    Induction:   Airway Management Planned:   Additional Equipment:   Intra-op Plan:   Post-operative Plan:   Informed Consent: I have reviewed the patients History and Physical, chart, labs and discussed the procedure including the risks, benefits and alternatives for the proposed anesthesia with the patient or authorized representative who has indicated his/her understanding and acceptance.   Dental Advisory Given  Plan Discussed with: CRNA  Anesthesia Plan Comments:         Anesthesia Quick Evaluation

## 2016-04-08 NOTE — Anesthesia Procedure Notes (Signed)
Procedure Name: MAC Date/Time: 04/08/2016 10:47 AM Performed by: Janna Arch Pre-anesthesia Checklist: Patient identified, Emergency Drugs available, Suction available and Patient being monitored Patient Re-evaluated:Patient Re-evaluated prior to inductionOxygen Delivery Method: Nasal cannula

## 2016-04-08 NOTE — Transfer of Care (Signed)
Immediate Anesthesia Transfer of Care Note  Patient: Carol Conley  Procedure(s) Performed: Procedure(s): ESOPHAGOGASTRODUODENOSCOPY (EGD) WITH PROPOFOL (N/A) ESOPHAGEAL DILATION  Patient Location: PACU  Anesthesia Type: MAC  Level of Consciousness: awake, alert  and patient cooperative  Airway and Oxygen Therapy: Patient Spontanous Breathing and Patient connected to supplemental oxygen  Post-op Assessment: Post-op Vital signs reviewed, Patient's Cardiovascular Status Stable, Respiratory Function Stable, Patent Airway and No signs of Nausea or vomiting  Post-op Vital Signs: Reviewed and stable  Complications: No apparent anesthesia complications

## 2016-04-08 NOTE — Anesthesia Postprocedure Evaluation (Signed)
Anesthesia Post Note  Patient: Kimyetta Flott Rinella  Procedure(s) Performed: Procedure(s) (LRB): ESOPHAGOGASTRODUODENOSCOPY (EGD) WITH PROPOFOL (N/A) ESOPHAGEAL DILATION  Patient location during evaluation: PACU Anesthesia Type: MAC Level of consciousness: awake and alert Pain management: pain level controlled Vital Signs Assessment: post-procedure vital signs reviewed and stable Respiratory status: spontaneous breathing, nonlabored ventilation, respiratory function stable and patient connected to nasal cannula oxygen Cardiovascular status: stable and blood pressure returned to baseline Anesthetic complications: no    Alisa Graff

## 2016-04-08 NOTE — Op Note (Signed)
[facility] Patient Name: Carol Conley Procedure Date: 04/08/2016 10:37 AM MRN: [id number] Account #: [id number] Date of Birth: 06-13-1990 Admit Type: Outpatient Age: 26 Room: [facility] OR ROOM 01 Gender: Female Note Status: Finalized Procedure:            Upper GI endoscopy Indications:          Dysphagia Providers:            [name] MD, MD Referring MD:         Filbert Berthold (Referring MD) Medicines:            Propofol per Anesthesia Complications:        No immediate complications. Procedure:            Pre-Anesthesia Assessment:                       - Prior to the procedure, a History and Physical was                        performed, and patient medications and allergies were                        reviewed. The patient's tolerance of previous                        anesthesia was also reviewed. The risks and benefits of                        the procedure and the sedation options and risks were                        discussed with the patient. All questions were                        answered, and informed consent was obtained. Prior                        Anticoagulants: The patient has taken no previous                        anticoagulant or antiplatelet agents. ASA Grade                        Assessment: II - A patient with mild systemic disease.                        After reviewing the risks and benefits, the patient was                        deemed in satisfactory condition to undergo the                        procedure.                       After obtaining informed consent, the endoscope was                        passed under direct vision. Throughout the procedure,  the patient's blood pressure, pulse, and oxygen                        saturations were monitored continuously. The Olympus                        GIF H180J colonscope (S#:[id number]) (Z#:[id number] introduced                        through the mouth,  and advanced to the second part of                        duodenum. The upper GI endoscopy was accomplished                        without difficulty. The patient tolerated the procedure                        well. Findings:      One mild benign-appearing, intrinsic stenosis was found in the upper       third of the esophagus. And was traversed. The scope was withdrawn.       Dilation was performed with a Maloney dilator with no resistance at 52       Fr.      A small hiatal hernia was present.      The stomach was normal.      The examined duodenum was normal. Biopsies were taken with a cold       forceps for histology. Impression:           - Benign-appearing esophageal stenosis. Dilated.                       - Small hiatal hernia.                       - Normal stomach.                       - Normal examined duodenum. Biopsied. Recommendation:       - Await pathology results.                       - Discharge patient to home.                       - Resume previous diet.                       - Continue present medications. Procedure Code(s):    --- Professional ---                       [phone], Esophagogastroduodenoscopy, flexible, transoral;                        with biopsy, single or multiple                       43450, Dilation of esophagus, by unguided sound or                        bougie, single or multiple passes Diagnosis Code(s):    --- Professional ---  R13.10, Dysphagia, unspecified                       K22.2, Esophageal obstruction CPT copyright 2016 American Medical Association. All rights reserved. The codes documented in this report are preliminary and upon coder review may  be revised to meet current compliance requirements. [name] MD, MD 04/08/2016 10:58:26 AM This report has been signed electronically. Number of Addenda: 0 Note Initiated On: 04/08/2016 10:37 AM Total Procedure Duration: 0 hours 4 minutes 44 seconds        [facility]

## 2016-04-09 ENCOUNTER — Encounter: Payer: Self-pay | Admitting: Gastroenterology

## 2016-04-09 ENCOUNTER — Ambulatory Visit: Payer: 59 | Admitting: Gastroenterology

## 2016-04-10 LAB — SURGICAL PATHOLOGY

## 2016-04-14 ENCOUNTER — Encounter: Payer: Self-pay | Admitting: Gastroenterology

## 2016-05-16 ENCOUNTER — Telehealth: Payer: Self-pay | Admitting: Gastroenterology

## 2016-05-16 ENCOUNTER — Other Ambulatory Visit: Payer: Self-pay

## 2016-05-16 MED ORDER — DEXLANSOPRAZOLE 60 MG PO CPDR: 60.0000 mg | DELAYED_RELEASE_CAPSULE | Freq: Every day | ORAL | 11 refills | Status: DC

## 2016-05-16 NOTE — Telephone Encounter (Signed)
Rx sent to pt's pharmacy per her request.  

## 2016-05-16 NOTE — Telephone Encounter (Signed)
Needs refill on [name] since she is now taking it 2x's daily  [facility]

## 2016-09-26 ENCOUNTER — Other Ambulatory Visit: Payer: Self-pay | Admitting: Gastroenterology

## 2016-11-26 ENCOUNTER — Other Ambulatory Visit: Payer: Self-pay | Admitting: Gastroenterology

## 2016-11-26 DIAGNOSIS — R110 Nausea: Secondary | ICD-10-CM

## 2016-12-12 ENCOUNTER — Encounter: Payer: Self-pay | Admitting: Gastroenterology

## 2016-12-12 ENCOUNTER — Ambulatory Visit (INDEPENDENT_AMBULATORY_CARE_PROVIDER_SITE_OTHER): Payer: 59 | Admitting: Gastroenterology

## 2016-12-12 VITALS — BP 118/68 | HR 94 | Temp 99.0°F | Ht 65.0 in | Wt 145.0 lb

## 2016-12-12 DIAGNOSIS — R110 Nausea: Secondary | ICD-10-CM

## 2016-12-12 DIAGNOSIS — K2100 Gastro-esophageal reflux disease with esophagitis, without bleeding: Secondary | ICD-10-CM

## 2016-12-12 NOTE — Patient Instructions (Signed)
You are scheduled for a Barium Swallow at [facility] on  Monday, Oct 1st @ 9:30am. Please arrive and check in at the medical mall registration desk at 9:15am. You cannot eat or drink 3 hours prior to scan.  If you need to reschedule this appointment for any reason, please contact central scheduling at [phone].

## 2016-12-12 NOTE — Progress Notes (Signed)
Primary Care Physician: No primary care provider on file.  Primary Gastroenterologist:  Dr. [name]  Chief Complaint  Patient presents with  . Gastroesophageal Reflux    HPI: Carol Conley is a 26 y.o. female here for continued heartburn and nausea.  The patient also reports that she has had to pull over when driving due to the feeling of food getting stuck on the way down.  The patient has had an EGD in the past with dilation.  The patient states that her constipation is much better now than it has been in the past.  Despite taking [name] in the evening the patient continues to have what she describes as horrific heartburn. There is no report of any unexplained weight loss fevers chills black stools or bloody stools.  Current Outpatient Prescriptions  Medication Sig Dispense Refill  . [name];[name] Inhale 1-2 puffs into the lungs every 6 (six) hours as needed for wheezing or shortness of breath. Or cough 1 Inhaler 11  . aspirin-acetaminophen-caffeine (EXCEDRIN MIGRAINE) 250-250-65 MG per tablet Take 2 tablets by mouth every 8 (eight) hours as needed for migraine.     Marland Kitchen dexlansoprazole ([name]) 60 MG capsule Take 1 capsule (60 mg total) by mouth daily. 60 capsule 11  . [name] TAKE 1 CAPSULE BY MOUTH 4 TIMES DAILY - BEFORE MEALS AND AT BEDTIME. 90 capsule 3  . [name] Take 180 mg by mouth daily. As needed    . fluticasone (FLONASE) 50 MCG/ACT nasal spray Place 2 sprays into both nostrils daily. (Patient taking differently: Place 2 sprays into both nostrils daily. As needed) 16 g 2  . [name] Place 1 suppository (25 mg total) rectally 2 (two) times daily. (Patient taking differently: Place 25 mg rectally 2 (two) times daily as needed. ) 14 suppository 2  . [name] 1-20 MG-MCG tablet Take 1 tablet by mouth daily. 4 Package 4  .  Lidocaine, [name], [name] Relief of anorectal pain and itching: Apply small, pea-sized amount of cream to affected area up to 6 times daily as needed 30 g 1  . [name] Take 1 tablet (25 mg total) by mouth every 6 (six) hours as needed for nausea or vomiting. 30 tablet 6  . [name] Take 5 mg by mouth 4 (four) times daily.    . [name] TAKE 1 TABLET BY MOUTH EVERY 8 HOURS AS NEEDED FOR NAUSEA OR VOMITING. 30 tablet 5  . [name] Place 1 application rectally 2 (two) times daily as needed for hemorrhoids. (Patient not taking: Reported on 12/12/2016) 30 g 0  . [name] Take by mouth.     No current facility-administered medications for this visit.     Allergies as of 12/12/2016 - Review Complete 12/12/2016  Allergen Reaction Noted  . Banana Itching 04/05/2016  . [name] Hives 08/10/2014  . Cantaloupe (diagnostic) Itching 04/05/2016  . [name] Hives 08/10/2014    ROS:  General: Negative for anorexia, weight loss, fever, chills, fatigue, weakness. ENT: Negative for hoarseness, difficulty swallowing , nasal congestion. CV: Negative for chest pain, angina, palpitations, dyspnea on exertion, peripheral edema.  Respiratory: Negative for dyspnea at rest, dyspnea on exertion, cough, sputum, wheezing.  GI: See history of present illness.  GU:  Negative for dysuria, hematuria, urinary incontinence, urinary frequency, nocturnal urination.  Endo: Negative for unusual weight change.    Physical Examination:   BP 118/68   Pulse 94   Temp 99 F (37.2 C) (Oral)   Ht  (1.651 m)   Wt 145 lb (65.8 kg)   BMI 24.13 kg/m   General: Well-nourished, well-developed in no acute distress.  Eyes: No icterus. Conjunctivae pink. Mouth: Oropharyngeal  mucosa moist and pink , no lesions erythema or exudate. Lungs: Clear to auscultation bilaterally. Non-labored. Heart: Regular rate and rhythm, no murmurs rubs or gallops.  Abdomen: Bowel sounds are normal, Diffusely tender, nondistended, no hepatosplenomegaly or masses, no abdominal bruits or hernia , no rebound or guarding.   Extremities: No lower extremity edema. No clubbing or deformities. Neuro: Alert and oriented x 3.  Grossly intact. Skin: Warm and dry, no jaundice.   Psych: Alert and cooperative, normal mood and affect.  Labs:    Imaging Studies: No results found.  Assessment and Plan:   Carol Conley is a 26 y.o. y/o female With a history of dysphagia the past 2 now reports that she is having nausea with dysphagia.  The patient has not had any vomiting.  There is no report of any problems with constipation and she has it pretty well under control.  The patient does have continued acid breakthrough despite her daily [name] use.  The patient will be set up for a barium swallow and pending the results of this may need to undergo a repeat EGD with dilation versus esophageal manometry with pH studies and she is not having complete resolution of her acid reflux on a PPI.  The patient has been explained the plan and agrees with it.    [name], MD. Clementeen Graham   Note: This dictation was prepared with Dragon dictation along with smaller phrase technology. Any transcriptional errors that result from this process are unintentional.

## 2016-12-16 ENCOUNTER — Ambulatory Visit
Admission: RE | Admit: 2016-12-16 | Discharge: 2016-12-16 | Disposition: A | Discharge status: Home / Self Care | Payer: 59 | Source: Ambulatory Visit | Attending: Gastroenterology | Admitting: Gastroenterology

## 2016-12-16 ENCOUNTER — Telehealth: Payer: Self-pay

## 2016-12-16 ENCOUNTER — Other Ambulatory Visit: Payer: Self-pay

## 2016-12-16 DIAGNOSIS — K219 Gastro-esophageal reflux disease without esophagitis: Secondary | ICD-10-CM | POA: Diagnosis not present

## 2016-12-16 DIAGNOSIS — K222 Esophageal obstruction: Secondary | ICD-10-CM | POA: Diagnosis not present

## 2016-12-16 DIAGNOSIS — R1319 Other dysphagia: Secondary | ICD-10-CM

## 2016-12-16 DIAGNOSIS — K2100 Gastro-esophageal reflux disease with esophagitis, without bleeding: Secondary | ICD-10-CM

## 2016-12-16 DIAGNOSIS — R1310 Dysphagia, unspecified: Secondary | ICD-10-CM

## 2016-12-16 NOTE — Telephone Encounter (Signed)
Pt notified and scheduled for an EGD at [facility] on 12/26/16.

## 2016-12-16 NOTE — Telephone Encounter (Signed)
-----   Message from [name], MD sent at 12/16/2016 12:01 PM EDT ----- Let the patient know that her barium swallow showed a stricture in her esophagus and she should be set up for an EGD.

## 2016-12-23 ENCOUNTER — Encounter: Payer: Self-pay | Admitting: *Deleted

## 2016-12-25 NOTE — Discharge Instructions (Signed)
General Anesthesia, Adult, Care After °These instructions provide you with information about caring for yourself after your procedure. Your health care provider may also give you more specific instructions. Your treatment has been planned according to current medical practices, but problems sometimes occur. Call your health care provider if you have any problems or questions after your procedure. °What can I expect after the procedure? °After the procedure, it is common to have: °· Vomiting. °· A sore throat. °· Mental slowness. ° °It is common to feel: °· Nauseous. °· Cold or shivery. °· Sleepy. °· Tired. °· Sore or achy, even in parts of your body where you did not have surgery. ° °Follow these instructions at home: °For at least 24 hours after the procedure: °· Do not: °? Participate in activities where you could fall or become injured. °? Drive. °? Use heavy machinery. °? Drink alcohol. °? Take sleeping pills or medicines that cause drowsiness. °? Make important decisions or sign legal documents. °? Take care of children on your own. °· Rest. °Eating and drinking °· If you vomit, drink water, juice, or soup when you can drink without vomiting. °· Drink enough fluid to keep your urine clear or pale yellow. °· Make sure you have little or no nausea before eating solid foods. °· Follow the diet recommended by your health care provider. °General instructions °· Have a responsible adult stay with you until you are awake and alert. °· Return to your normal activities as told by your health care provider. Ask your health care provider what activities are safe for you. °· Take over-the-counter and prescription medicines only as told by your health care provider. °· If you smoke, do not smoke without supervision. °· Keep all follow-up visits as told by your health care provider. This is important. °Contact a health care provider if: °· You continue to have nausea or vomiting at home, and medicines are not helpful. °· You  cannot drink fluids or start eating again. °· You cannot urinate after 8-12 hours. °· You develop a skin rash. °· You have fever. °· You have increasing redness at the site of your procedure. °Get help right away if: °· You have difficulty breathing. °· You have chest pain. °· You have unexpected bleeding. °· You feel that you are having a life-threatening or urgent problem. °This information is not intended to replace advice given to you by your health care provider. Make sure you discuss any questions you have with your health care provider. °Document Released: 06/10/2000 Document Revised: 08/07/2015 Document Reviewed: 02/16/2015 °Elsevier Interactive Patient Education © 2018 Elsevier Inc. ° °

## 2016-12-26 ENCOUNTER — Ambulatory Visit: Payer: 59 | Admitting: Anesthesiology

## 2016-12-26 ENCOUNTER — Ambulatory Visit
Admission: RE | Admit: 2016-12-26 | Discharge: 2016-12-26 | Disposition: A | Discharge status: Home / Self Care | Payer: 59 | Source: Ambulatory Visit | Attending: Gastroenterology | Admitting: Gastroenterology

## 2016-12-26 ENCOUNTER — Encounter
Admission: RE | Disposition: A | Discharge status: Home / Self Care | Payer: Self-pay | Source: Ambulatory Visit | Attending: Gastroenterology

## 2016-12-26 DIAGNOSIS — R1310 Dysphagia, unspecified: Secondary | ICD-10-CM | POA: Insufficient documentation

## 2016-12-26 DIAGNOSIS — Z881 Allergy status to other antibiotic agents status: Secondary | ICD-10-CM | POA: Diagnosis not present

## 2016-12-26 DIAGNOSIS — K219 Gastro-esophageal reflux disease without esophagitis: Secondary | ICD-10-CM | POA: Insufficient documentation

## 2016-12-26 DIAGNOSIS — Z91018 Allergy to other foods: Secondary | ICD-10-CM | POA: Insufficient documentation

## 2016-12-26 DIAGNOSIS — K449 Diaphragmatic hernia without obstruction or gangrene: Secondary | ICD-10-CM | POA: Insufficient documentation

## 2016-12-26 DIAGNOSIS — K229 Disease of esophagus, unspecified: Secondary | ICD-10-CM | POA: Diagnosis not present

## 2016-12-26 DIAGNOSIS — Z79899 Other long term (current) drug therapy: Secondary | ICD-10-CM | POA: Diagnosis not present

## 2016-12-26 DIAGNOSIS — K222 Esophageal obstruction: Secondary | ICD-10-CM | POA: Insufficient documentation

## 2016-12-26 DIAGNOSIS — Z7982 Long term (current) use of aspirin: Secondary | ICD-10-CM | POA: Diagnosis not present

## 2016-12-26 DIAGNOSIS — Z808 Family history of malignant neoplasm of other organs or systems: Secondary | ICD-10-CM | POA: Insufficient documentation

## 2016-12-26 DIAGNOSIS — R1319 Other dysphagia: Secondary | ICD-10-CM

## 2016-12-26 DIAGNOSIS — Z7951 Long term (current) use of inhaled steroids: Secondary | ICD-10-CM | POA: Insufficient documentation

## 2016-12-26 HISTORY — PX: ESOPHAGEAL DILATION: SHX303

## 2016-12-26 HISTORY — PX: ESOPHAGOGASTRODUODENOSCOPY (EGD) WITH PROPOFOL: SHX5813

## 2016-12-26 SURGERY — ESOPHAGOGASTRODUODENOSCOPY (EGD) WITH PROPOFOL
Anesthesia: General | Wound class: Clean Contaminated

## 2016-12-26 MED ORDER — OXYCODONE HCL 5 MG PO TABS: 5.0000 mg | ORAL_TABLET | Freq: Once | ORAL | Status: DC | PRN

## 2016-12-26 MED ORDER — PROPOFOL 10 MG/ML IV BOLUS
INTRAVENOUS | Status: DC | PRN
  Administered 2016-12-26 (×5): 50 mg via INTRAVENOUS
  Administered 2016-12-26: 100 mg via INTRAVENOUS

## 2016-12-26 MED ORDER — MEPERIDINE HCL 25 MG/ML IJ SOLN: 6.2500 mg | INTRAMUSCULAR | Status: DC | PRN

## 2016-12-26 MED ORDER — PROMETHAZINE HCL 25 MG/ML IJ SOLN: 6.2500 mg | INTRAMUSCULAR | Status: DC | PRN

## 2016-12-26 MED ORDER — FENTANYL CITRATE (PF) 100 MCG/2ML IJ SOLN: 25.0000 ug | INTRAMUSCULAR | Status: DC | PRN

## 2016-12-26 MED ORDER — GLYCOPYRROLATE 0.2 MG/ML IJ SOLN
INTRAMUSCULAR | Status: DC | PRN
  Administered 2016-12-26 (×2): 0.1 mg via INTRAVENOUS

## 2016-12-26 MED ORDER — SODIUM CHLORIDE 0.9 % IV SOLN: INTRAVENOUS | Status: DC

## 2016-12-26 MED ORDER — LACTATED RINGERS IV SOLN
10.0000 mL/h | INTRAVENOUS | Status: DC
Start: 2016-12-26 — End: 2016-12-26
  Administered 2016-12-26: 10 mL/h via INTRAVENOUS

## 2016-12-26 MED ORDER — OXYCODONE HCL 5 MG/5ML PO SOLN: 5.0000 mg | Freq: Once | ORAL | Status: DC | PRN

## 2016-12-26 MED ORDER — LIDOCAINE HCL (CARDIAC) 20 MG/ML IV SOLN
INTRAVENOUS | Status: DC | PRN
  Administered 2016-12-26: 40 mg via INTRAVENOUS

## 2016-12-26 SURGICAL SUPPLY — 32 items
BALLN DILATOR 10-12 8 (BALLOONS)
BALLN DILATOR 12-15 8 (BALLOONS)
BALLN DILATOR 15-18 8 (BALLOONS) ×3
BALLN DILATOR CRE 0-12 8 (BALLOONS)
BALLN DILATOR ESOPH 8 10 CRE (MISCELLANEOUS) IMPLANT
BALLOON DILATOR 12-15 8 (BALLOONS) IMPLANT
BALLOON DILATOR 15-18 8 (BALLOONS) ×2 IMPLANT
BALLOON DILATOR CRE 0-12 8 (BALLOONS) IMPLANT
BLOCK BITE 60FR ADLT L/F GRN (MISCELLANEOUS) ×3 IMPLANT
CANISTER SUCT 1200ML W/VALVE (MISCELLANEOUS) ×3 IMPLANT
CLIP HMST 235XBRD CATH ROT (MISCELLANEOUS) IMPLANT
CLIP RESOLUTION 360 11X235 (MISCELLANEOUS)
FCP ESCP3.2XJMB 240X2.8X (MISCELLANEOUS)
FORCEPS BIOP RAD 4 LRG CAP 4 (CUTTING FORCEPS) ×3 IMPLANT
FORCEPS BIOP RJ4 240 W/NDL (MISCELLANEOUS)
FORCEPS ESCP3.2XJMB 240X2.8X (MISCELLANEOUS) IMPLANT
GOWN CVR UNV OPN BCK APRN NK (MISCELLANEOUS) ×4 IMPLANT
GOWN ISOL THUMB LOOP REG UNIV (MISCELLANEOUS) ×2
INJECTOR VARIJECT VIN23 (MISCELLANEOUS) IMPLANT
KIT DEFENDO VALVE AND CONN (KITS) IMPLANT
KIT ENDO PROCEDURE OLY (KITS) ×3 IMPLANT
MARKER SPOT ENDO TATTOO 5ML (MISCELLANEOUS) IMPLANT
PAD GROUND ADULT SPLIT (MISCELLANEOUS) IMPLANT
RETRIEVER NET PLAT FOOD (MISCELLANEOUS) IMPLANT
SNARE SHORT THROW 13M SML OVAL (MISCELLANEOUS) IMPLANT
SNARE SHORT THROW 30M LRG OVAL (MISCELLANEOUS) IMPLANT
SPOT EX ENDOSCOPIC TATTOO (MISCELLANEOUS)
SYR INFLATION 60ML (SYRINGE) ×3 IMPLANT
TRAP ETRAP POLY (MISCELLANEOUS) IMPLANT
VARIJECT INJECTOR VIN23 (MISCELLANEOUS)
WATER STERILE IRR 250ML POUR (IV SOLUTION) ×3 IMPLANT
WIRE CRE 18-20MM 8CM F G (MISCELLANEOUS) IMPLANT

## 2016-12-26 NOTE — H&P (Signed)
[name], MD [facility] [address] [place], [place] [address] [phone] Fax : [phone]  Primary Care Physician:  Patient, No Pcp Per Primary Gastroenterologist:  Dr. Servando Snare  [name] HPI:  Carol Conley is a 26 y.o. female is here for an endoscopy.   Past Medical History:  Diagnosis Date  . Allergy   . Asthma    as child  . Colitis   . GERD (gastroesophageal reflux disease)   . Headache    sinus  . IBS (irritable bowel syndrome)   . IgA deficiency, selective (HCC)   . Motion sickness    cars, planes  . Sinusitis     Past Surgical History:  Procedure Laterality Date  . ESOPHAGEAL DILATION  04/08/2016   Procedure: ESOPHAGEAL DILATION;  Surgeon: [name], MD;  Location: [facility];  Service: Endoscopy;;  . ESOPHAGOGASTRODUODENOSCOPY (EGD) WITH PROPOFOL N/A 04/08/2016   Procedure: ESOPHAGOGASTRODUODENOSCOPY (EGD) WITH PROPOFOL;  Surgeon: [name], MD;  Location: [facility];  Service: Endoscopy;  Laterality: N/A;  . OVARIAN CYST REMOVAL Right    2007  . [facility]      [name]   Medication Sig Start Date End Date Taking? Authorizing Provider  aspirin-acetaminophen-caffeine (EXCEDRIN MIGRAINE) [phone] MG per tablet Take 2 tablets by mouth every 8 (eight) hours as needed for migraine.    Yes [provider]  dexlansoprazole ([name]) 60 MG capsule Take 1 capsule (60 mg total) by mouth daily. 05/16/16  Yes [name], MD  [name] TAKE 1 CAPSULE BY MOUTH 4 TIMES DAILY - BEFORE MEALS AND AT BEDTIME. 09/30/16  Yes Raechelle Sarti, MD  [name] Take 180 mg by mouth daily. As needed   Yes [provider]  fluticasone (FLONASE) 50 MCG/ACT nasal spray Place 2 sprays into both nostrils daily. Patient taking differently: Place 2 sprays into both nostrils daily. As needed 08/10/14  Yes Rae Halsted, PA-C    [name] Place 1 suppository (25 mg total) rectally 2 (two) times daily. Patient taking differently: Place 25 mg rectally 2 (two) times daily as needed.  11/09/14  Yes [name], MD  [name] 1-20 MG-MCG tablet Take 1 tablet by mouth daily. 02/29/16  Yes Shambley, Melody N, CNM  Lidocaine, [name], [name] Relief of anorectal pain and itching: Apply small, pea-sized amount of cream to affected area up to 6 times daily as needed 03/07/15  Yes Tapia, Leisa, PA-C  [name] TAKE 1 TABLET BY MOUTH EVERY 8 HOURS AS NEEDED FOR NAUSEA OR VOMITING. 12/05/16  Yes [name], MD  [name] Place 1 application rectally 2 (two) times daily as needed for hemorrhoids. 03/07/15  Yes Danelle Berry, PA-C  [name] Take by mouth.   Yes [provider]  [name] Take 1 tablet (25 mg total) by mouth every 6 (six) hours as needed for nausea or vomiting. 04/02/16  Yes [name], MD  [name];[name] Inhale 1-2 puffs into the lungs every 6 (six) hours as needed for wheezing or shortness of breath. Or cough 08/31/15   Krebs, Laurel Dimmer, NP  [name] Take 5 mg by mouth 4 (four) times daily.    [provider]    Allergies as  of 12/16/2016 - Review Complete 12/12/2016  Allergen Reaction Noted  . Banana Itching 04/05/2016  . [name] Hives 08/10/2014  . Cantaloupe (diagnostic) Itching 04/05/2016  . [name] Hives 08/10/2014    Family History  Problem Relation Age of Onset  . Cancer Father        skin    Social History   Social History  . [name]    [name]  . [name]  . [name]   Occupational  History  . Not on file.   Social History Main Topics  . Smoking status: Never Smoker  . Smokeless tobacco: Never Used  . [name]  . [name]  . [name]   Other Topics Concern  . Not on file   Social History Narrative  . No narrative on file    [name], [name]  Physical Exam: Ht  (1.651 m)   Wt 145 lb (65.8 kg)   BMI 24.13 kg/m  General:   Alert,  pleasant and cooperative in NAD Head:  Normocephalic and atraumatic. Neck:  Supple; no masses or thyromegaly. Lungs:  Clear throughout to auscultation.    Heart:  Regular rate and rhythm. Abdomen:  Soft, nontender and nondistended. Normal bowel sounds, without guarding, and without rebound.   Neurologic:  Alert and  oriented x4;  grossly normal neurologically.  Impression/Plan: Carol Conley is here for an endoscopy to be performed for dysphagia  Risks, benefits, limitations, and alternatives regarding  endoscopy have been reviewed with the patient.  Questions have been answered.  All parties agreeable.   [name], MD  12/26/2016, 7:43 AM

## 2016-12-26 NOTE — Anesthesia Preprocedure Evaluation (Signed)
Anesthesia Evaluation  Patient identified by MRN, date of birth, ID band Patient awake    Reviewed: Allergy & Precautions, H&P , NPO status , Patient's Chart, lab work & pertinent test results, reviewed documented beta blocker date and time   Airway Mallampati: II  TM Distance: >3 FB Neck ROM: full    Dental no notable dental hx.    Pulmonary asthma ,    Pulmonary exam normal breath sounds clear to auscultation       Cardiovascular Exercise Tolerance: Good negative cardio ROS   Rhythm:regular Rate:Normal     Neuro/Psych  Headaches, negative psych ROS   GI/Hepatic negative GI ROS, Neg liver ROS, GERD  ,IBS   Endo/Other  negative endocrine ROS  Renal/GU negative Renal ROS  negative genitourinary   Musculoskeletal   Abdominal   Peds  Hematology negative hematology ROS (+)   Anesthesia Other Findings   Reproductive/Obstetrics negative OB ROS                             Anesthesia Physical  Anesthesia Plan  ASA: II  Anesthesia Plan: General   Post-op Pain Management:    Induction:   PONV Risk Score and Plan:   Airway Management Planned:   Additional Equipment:   Intra-op Plan:   Post-operative Plan:   Informed Consent: I have reviewed the patients History and Physical, chart, labs and discussed the procedure including the risks, benefits and alternatives for the proposed anesthesia with the patient or authorized representative who has indicated his/her understanding and acceptance.   Dental Advisory Given  Plan Discussed with: CRNA  Anesthesia Plan Comments:         Anesthesia Quick Evaluation

## 2016-12-26 NOTE — Transfer of Care (Signed)
Immediate Anesthesia Transfer of Care Note  Patient: Carol Conley  Procedure(s) Performed: ESOPHAGOGASTRODUODENOSCOPY (EGD) WITH PROPOFOL (N/A ) ESOPHAGEAL DILATION  Patient Location: PACU  Anesthesia Type: General  Level of Consciousness: awake, alert  and patient cooperative  Airway and Oxygen Therapy: Patient Spontanous Breathing and Patient connected to supplemental oxygen  Post-op Assessment: Post-op Vital signs reviewed, Patient's Cardiovascular Status Stable, Respiratory Function Stable, Patent Airway and No signs of Nausea or vomiting  Post-op Vital Signs: Reviewed and stable  Complications: No apparent anesthesia complications

## 2016-12-26 NOTE — Anesthesia Postprocedure Evaluation (Signed)
Anesthesia Post Note  Patient: Carol Conley  Procedure(s) Performed: ESOPHAGOGASTRODUODENOSCOPY (EGD) WITH PROPOFOL (N/A ) ESOPHAGEAL DILATION  Patient location during evaluation: PACU Anesthesia Type: General Level of consciousness: awake and alert Pain management: pain level controlled Vital Signs Assessment: post-procedure vital signs reviewed and stable Respiratory status: spontaneous breathing, nonlabored ventilation, respiratory function stable and patient connected to nasal cannula oxygen Cardiovascular status: blood pressure returned to baseline and stable Postop Assessment: no apparent nausea or vomiting Anesthetic complications: no    SCOURAS, NICOLE ELAINE

## 2016-12-26 NOTE — Op Note (Signed)
[facility] Gastroenterology Patient Name: Carol Conley Procedure Date: 12/26/2016 8:58 AM MRN: [id number] Account #: [id number] Date of Birth: 12/05/1990 Admit Type: Outpatient Age: 26 Room: [facility] OR ROOM 01 Gender: Female Note Status: Finalized Procedure:            Upper GI endoscopy Indications:          Dysphagia Providers:            [name] MD, MD Medicines:            Propofol per Anesthesia Complications:        No immediate complications. Procedure:            Pre-Anesthesia Assessment:                       - Prior to the procedure, a History and Physical was                        performed, and patient medications and allergies were                        reviewed. The patient's tolerance of previous                        anesthesia was also reviewed. The risks and benefits of                        the procedure and the sedation options and risks were                        discussed with the patient. All questions were                        answered, and informed consent was obtained. Prior                        Anticoagulants: The patient has taken no previous                        anticoagulant or antiplatelet agents. ASA Grade                        Assessment: II - A patient with mild systemic disease.                        After reviewing the risks and benefits, the patient was                        deemed in satisfactory condition to undergo the                        procedure.                       After obtaining informed consent, the endoscope was                        passed under direct vision. Throughout the procedure,                        the patient's blood pressure, pulse, and  oxygen                        saturations were monitored continuously. The Olympus                        190 Endoscope [id number]) was introduced through the                        mouth, and advanced to the second part of duodenum. The               upper GI endoscopy was accomplished without difficulty.                        The patient tolerated the procedure well. Findings:      A small hiatal hernia was present.      Two biopsies were obtained with cold forceps for histology in the middle       third of the esophagus.      One mild benign-appearing, intrinsic stenosis was found in the upper       third of the esophagus. And was traversed. A TTS dilator was passed       through the scope. Dilation with a 15-16.5-18 mm balloon dilator was       performed to 18 mm. The dilation site was examined following endoscope       reinsertion and showed complete resolution of luminal narrowing.      Food (residue) was found in the gastric body.      The examined duodenum was normal. Impression:           - Small hiatal hernia.                       - Benign-appearing esophageal stenosis. Dilated.                       - Food (residue) in the stomach.                       - Normal examined duodenum.                       - Biopsy performed in the middle third of the esophagus. Recommendation:       - Discharge patient to home.                       - Resume previous diet.                       - Continue present medications.                       - Await pathology results. Procedure Code(s):    --- Professional ---                       [phone], Esophagogastroduodenoscopy, flexible, transoral;                        with transendoscopic balloon dilation of esophagus                        (less than 30 mm diameter)  60454, Esophagogastroduodenoscopy, flexible, transoral;                        with biopsy, single or multiple Diagnosis Code(s):    --- Professional ---                       R13.10, Dysphagia, unspecified                       K22.2, Esophageal obstruction CPT copyright 2016 American Medical Association. All rights reserved. The codes documented in this report are preliminary and upon coder  review may  be revised to meet current compliance requirements. [name] MD, MD 12/26/2016 9:13:25 AM This report has been signed electronically. Number of Addenda: 0 Note Initiated On: 12/26/2016 8:58 AM Total Procedure Duration: 0 hours 7 minutes 30 seconds       [facility]

## 2016-12-26 NOTE — Anesthesia Procedure Notes (Signed)
Procedure Name: MAC Date/Time: 12/26/2016 8:58 AM Performed by: Janna Arch Pre-anesthesia Checklist: Patient identified, Emergency Drugs available, Suction available and Patient being monitored Patient Re-evaluated:Patient Re-evaluated prior to induction Oxygen Delivery Method: Nasal cannula

## 2016-12-27 ENCOUNTER — Encounter: Payer: Self-pay | Admitting: Gastroenterology

## 2016-12-31 ENCOUNTER — Encounter: Payer: Self-pay | Admitting: Gastroenterology

## 2017-01-01 ENCOUNTER — Encounter: Payer: Self-pay | Admitting: Gastroenterology

## 2017-01-10 ENCOUNTER — Telehealth: Payer: Self-pay

## 2017-01-10 NOTE — Telephone Encounter (Signed)
Pt called wanting to know what she needs to do next. She is still having the reflux. Does she need to make an appt? Her recent EGD was normal.

## 2017-01-13 ENCOUNTER — Other Ambulatory Visit: Payer: Self-pay

## 2017-01-13 DIAGNOSIS — R112 Nausea with vomiting, unspecified: Secondary | ICD-10-CM

## 2017-01-13 NOTE — Telephone Encounter (Signed)
Her recent EGD showed her to have stenosis that was dilated and a stomach full of food.  If the patient is taking her PPI twice a day and still having acid reflux she should be set up for a gastric emptying study and possible pH studies if the gastric emptying study is normal.  If she is not taking her PPI twice a day she should try taking it twice a day.

## 2017-01-13 NOTE — Telephone Encounter (Signed)
[facility] on Friday, Nov 9th @ 10:00am. Left vm letting pt know this information along with instructions.

## 2017-01-14 NOTE — Telephone Encounter (Signed)
Pt returned call stating she received my message and will be able to go to her appt.

## 2017-01-24 ENCOUNTER — Ambulatory Visit
Admission: RE | Admit: 2017-01-24 | Discharge: 2017-01-24 | Disposition: A | Discharge status: Home / Self Care | Payer: 59 | Source: Ambulatory Visit | Attending: Gastroenterology | Admitting: Gastroenterology

## 2017-01-24 DIAGNOSIS — R112 Nausea with vomiting, unspecified: Secondary | ICD-10-CM | POA: Insufficient documentation

## 2017-01-24 DIAGNOSIS — R109 Unspecified abdominal pain: Secondary | ICD-10-CM | POA: Diagnosis not present

## 2017-01-24 MED ORDER — TECHNETIUM TC 99M SULFUR COLLOID
2.0000 | Freq: Once | INTRAVENOUS | Status: AC | PRN
  Administered 2017-01-24: 2.41 via INTRAVENOUS

## 2017-01-27 ENCOUNTER — Telehealth: Payer: Self-pay

## 2017-01-27 NOTE — Telephone Encounter (Signed)
-----   Message from Midge Miniumarren Wohl, MD sent at 01/27/2017  2:02 PM EST ----- Let the patient know that her gastric emptying was normal for the first 3 hours but slightly delayed by our 4. This would explain why she had food still in her stomach during the EGD. The patient should try to not lay down for 4 hours before going to sleep and not eat big meals.

## 2017-01-27 NOTE — Telephone Encounter (Signed)
Pt returned call and results were discussed.  

## 2017-01-27 NOTE — Telephone Encounter (Signed)
LVM for pt to return my call.

## 2017-01-28 ENCOUNTER — Telehealth: Payer: Self-pay

## 2017-01-28 NOTE — Telephone Encounter (Signed)
Pt notified of gastric emptying study and recommendation from [name].

## 2017-01-28 NOTE — Telephone Encounter (Signed)
-----   Message from Darren Wohl, MD sent at 01/27/2017  2:02 PM EST ----- Let the patient know that her gastric emptying was normal for the first 3 hours but slightly delayed by our 4. This would explain why she had food still in her stomach during the EGD. The patient should try to not lay down for 4 hours before going to sleep and not eat big meals. 

## 2017-03-04 ENCOUNTER — Encounter: Payer: Self-pay | Admitting: Obstetrics and Gynecology

## 2017-03-04 ENCOUNTER — Encounter: Payer: 59 | Admitting: Obstetrics and Gynecology

## 2017-03-04 ENCOUNTER — Ambulatory Visit (INDEPENDENT_AMBULATORY_CARE_PROVIDER_SITE_OTHER): Payer: 59 | Admitting: Obstetrics and Gynecology

## 2017-03-04 VITALS — BP 118/71 | HR 103 | Ht 65.0 in | Wt 148.4 lb

## 2017-03-04 DIAGNOSIS — R5383 Other fatigue: Secondary | ICD-10-CM | POA: Diagnosis not present

## 2017-03-04 DIAGNOSIS — Z01419 Encounter for gynecological examination (general) (routine) without abnormal findings: Secondary | ICD-10-CM

## 2017-03-04 MED ORDER — JUNEL FE 1/20 1-20 MG-MCG PO TABS: 1.0000 | ORAL_TABLET | Freq: Every day | ORAL | 4 refills | Status: DC

## 2017-03-04 NOTE — Patient Instructions (Signed)
Place annual gynecologic exam patient instructions here.

## 2017-03-04 NOTE — Progress Notes (Signed)
  Subjective:     Carol Conley is a single white 26 y.o. female and is here for a comprehensive physical exam. 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 Is suppressing menses and happy with that. The patient reports problems - feels really sleepy and tired, working a lot but sleeping well, doesn't feel rested after sleeping. worse with IBS flares. .  Social History   Socioeconomic History  . [name]    Spouse name: Not on file  . Number of children: Not on file  . Years of education: Not on file  . Highest education level: Not on file  Social Needs  . Financial resource strain: Not on file  . Food insecurity - worry: Not on file  . Food insecurity - inability: Not on file  . Transportation needs - medical: Not on file  . Transportation needs - non-medical: Not on file  Occupational History  . Not on file  Tobacco Use  . Smoking status: Never Smoker  . Smokeless tobacco: Never Used  Substance and Sexual Activity  . Alcohol use: No  . [name]  . [name]    Birth control/protection: Pill  Other Topics Concern  . Not on file  Social History Narrative  . Not on file   Health Maintenance  Topic Date Due  . HIV Screening  04/16/2003  . PAP SMEAR  08/31/2018  . TETANUS/TDAP  03/18/2022    The following portions of the patient's history were reviewed and updated as appropriate: allergies, current medications, past family history, past medical history, past social history, past surgical history and problem list.  Review of Systems Pertinent items noted in HPI and remainder of comprehensive ROS otherwise negative.   Objective:    General appearance: alert, cooperative and appears stated age Neck: no adenopathy, no carotid bruit, no JVD, supple, symmetrical, trachea midline and thyroid not enlarged, symmetric, no tenderness/mass/nodules Lungs: clear to auscultation bilaterally Breasts: normal appearance, no masses or  tenderness Heart: regular rate and rhythm, S1, S2 normal, no murmur, click, rub or gallop Abdomen: soft, non-tender; bowel sounds normal; no masses,  no organomegaly Pelvic: dclined    Assessment:    Healthy female exam. fatigue     Plan:  Labs obtained, will follow up accordingly, ,meds refilled, RTC 1 year or as needed.    See After Visit Summary for Counseling Recommendations

## 2017-03-05 LAB — COMPREHENSIVE METABOLIC PANEL
ALT: 11 IU/L (ref 0–32)
AST: 15 IU/L (ref 0–40)
Albumin/Globulin Ratio: 1.5 (ref 1.2–2.2)
Albumin: 4.2 g/dL (ref 3.5–5.5)
Alkaline Phosphatase: 79 IU/L (ref 39–117)
BUN/Creatinine Ratio: 12 (ref 9–23)
BUN: 10 mg/dL (ref 6–20)
Bilirubin Total: <0.2 mg/dL (ref 0.0–1.2)
CALCIUM: 8.8 mg/dL (ref 8.7–10.2)
CO2: 23 mmol/L (ref 20–29)
CREATININE: 0.84 mg/dL (ref 0.57–1.00)
Chloride: 106 mmol/L (ref 96–106)
GFR calc Af Amer: 109 mL/min/{1.73_m2} (ref >59)
GFR, EST NON AFRICAN AMERICAN: 95 mL/min/{1.73_m2} (ref >59)
Globulin, Total: 2.8 g/dL (ref 1.5–4.5)
Glucose: 82 mg/dL (ref 65–99)
Potassium: 3.9 mmol/L (ref 3.5–5.2)
Sodium: 141 mmol/L (ref 134–144)
Total Protein: 7 g/dL (ref 6.0–8.5)

## 2017-03-05 LAB — CBC
Hematocrit: 36.4 % (ref 34.0–46.6)
Hemoglobin: 12.5 g/dL (ref 11.1–15.9)
MCH: 26.8 pg (ref 26.6–33.0)
MCHC: 34.3 g/dL (ref 31.5–35.7)
MCV: 78 fL — ABNORMAL LOW (ref 79–97)
PLATELETS: 307 10*3/uL (ref 150–379)
RBC: 4.67 x10E6/uL (ref 3.77–5.28)
RDW: 13.7 % (ref 12.3–15.4)
WBC: 5.6 10*3/uL (ref 3.4–10.8)

## 2017-03-05 LAB — FERRITIN: FERRITIN: 9 ng/mL — AB (ref 15–150)

## 2017-03-05 LAB — THYROID PANEL WITH TSH
Free Thyroxine Index: 2 (ref 1.2–4.9)
T3 UPTAKE RATIO: 20 % — AB (ref 24–39)
T4 TOTAL: 10.2 ug/dL (ref 4.5–12.0)
TSH: 1.67 u[IU]/mL (ref 0.450–4.500)

## 2017-03-05 LAB — B12 AND FOLATE PANEL
FOLATE: 14.9 ng/mL (ref >3.0)
VITAMIN B 12: 331 pg/mL (ref 232–1245)

## 2017-03-05 LAB — VITAMIN D 25 HYDROXY (VIT D DEFICIENCY, FRACTURES): Vit D, 25-Hydroxy: 30.4 ng/mL (ref 30.0–100.0)

## 2017-03-05 LAB — GLIA (IGA/G) + TTG IGA
Antigliadin Abs, IgA: 5 units (ref 0–19)
GLIADIN IGG: 3 U (ref 0–19)
Transglutaminase IgA: <2 U/mL (ref 0–3)

## 2017-03-06 ENCOUNTER — Other Ambulatory Visit: Payer: Self-pay | Admitting: Obstetrics and Gynecology

## 2017-03-06 MED ORDER — FUSION PLUS PO CAPS: 1.0000 | ORAL_CAPSULE | Freq: Every day | ORAL | 1 refills | Status: DC

## 2017-04-10 ENCOUNTER — Ambulatory Visit (INDEPENDENT_AMBULATORY_CARE_PROVIDER_SITE_OTHER): Payer: No Typology Code available for payment source | Admitting: Nurse Practitioner

## 2017-04-10 ENCOUNTER — Encounter: Payer: Self-pay | Admitting: Nurse Practitioner

## 2017-04-10 ENCOUNTER — Other Ambulatory Visit: Payer: Self-pay

## 2017-04-10 VITALS — BP 98/53 | HR 77 | Temp 98.5°F | Ht 64.0 in | Wt 147.0 lb

## 2017-04-10 DIAGNOSIS — R1310 Dysphagia, unspecified: Secondary | ICD-10-CM

## 2017-04-10 DIAGNOSIS — K529 Noninfective gastroenteritis and colitis, unspecified: Secondary | ICD-10-CM | POA: Diagnosis not present

## 2017-04-10 DIAGNOSIS — Z8742 Personal history of other diseases of the female genital tract: Secondary | ICD-10-CM | POA: Diagnosis not present

## 2017-04-10 DIAGNOSIS — G43919 Migraine, unspecified, intractable, without status migrainosus: Secondary | ICD-10-CM | POA: Diagnosis not present

## 2017-04-10 DIAGNOSIS — R1319 Other dysphagia: Secondary | ICD-10-CM

## 2017-04-10 DIAGNOSIS — K219 Gastro-esophageal reflux disease without esophagitis: Secondary | ICD-10-CM

## 2017-04-10 MED ORDER — SUMATRIPTAN SUCCINATE 50 MG PO TABS: ORAL_TABLET | ORAL | 0 refills | Status: DC

## 2017-04-10 NOTE — Patient Instructions (Addendum)
Morrie Sheldonshley, Thank you for coming in to clinic today.  1. Referrals have been placed for GI and GYN. 2. You have a migraine today.  - Sumatriptan 50 mg tablet.  Take 1 tablet.  Repeat in 2 hours x 3 total doses as needed in 24 hours for persistent headache.  - If your headache is not severe, you may take ibuprofen 800 mg with a single dose for abortive therapy.  Be careful about this dose with your GI issues.  Take this with a small amount of food.  Please schedule a follow-up appointment with Wilhelmina McardleLauren Neriah Brott, AGNP. Return in about 6 months (around 10/08/2017) for annual physical. and as needed 1-2 months for headaches.  If you have any other questions or concerns, please feel free to call the clinic or send a message through MyChart. You may also schedule an earlier appointment if necessary.  You will receive a survey after today's visit either digitally by e-mail or paper by [employer] mail. Your experiences and feedback matter to us.  Please respond so we know how we are doing as we provide care for you.   Wilhelmina McardleLauren Gust Eugene, DNP, AGNP-BC Adult Gerontology Nurse Practitioner [facility], [facility]

## 2017-04-10 NOTE — Progress Notes (Signed)
Subjective:    Patient ID: Carol Conley, female    DOB: 04-16-1990, 27 y.o.   MRN: [id number]  Carol Conley is a 27 y.o. female presenting on 04/10/2017 for Insurance Coordination (need a referral for continuation of care with (GI) Dr. Servando Snare, also to (OB GYN) Melody Shambley, CNM- ) and Headache (headaches that have increased over the past 4 mths. sensitivity to sound and lights.)   HPI Pt last seen by PCP Amy Krebs about 1 year ago here at [facility].  Coordination of care referrals with new insurance plan: Dr. Servando Snare, Melody Caldwell - IBS: recently dx gastroparesis, GERD, anal fissures, eophageal strictures. - Contraception, ovarian cysts in HS - managed by Melody  Headaches:  Has had sinus headahes for years, but has recently started developing severe episodic headaches with more intense pain.  Had very bad headache last night.  She reports taking phenergan for rest/to avoid nausea.  Is affecting left side of face/head.  She notes these occur more frequently if no sleep/no caffeine/rainy weather. 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 - They are associated with photophobia, but no phonophobia.   Pt usually takes excedrine (acetaminophen, asa, caffeine) x 2 tabs once daily as needed for headache.  Usually works but not effective recently.    Pt reports her abdominal pain is often preceded by flashing lights in her vision.  Sometimes these episodes of abdominal pain are also associated with headache. - GI symptoms also include tenesmus/diarrhea and are often associated with tachycardia and  dizziness.  Past Medical History:  Diagnosis Date  . Allergy   . Asthma    as child  . Colitis   . GERD (gastroesophageal reflux disease)   . Headache    sinus  . IBS (irritable bowel syndrome)   . IgA deficiency, selective (HCC)   . Motion sickness    cars, planes  . Sinusitis    Social History   Tobacco Use  . Smoking status: Never Smoker  .  Smokeless tobacco: Never Used  Substance Use Topics  . Alcohol use: Yes    Comment: occasionally   . [name]   Family History  Problem Relation Age of Onset  . Cancer Father        skin  . Thyroid disease Mother     Review of Systems Per HPI unless specifically indicated above     Objective:    BP (!) 98/53 (BP Location: Right Arm, Patient Position: Sitting, Cuff Size: Normal)   Pulse 77   Temp 98.5 F (36.9 C) (Oral)   Ht 5\' 4"  (1.626 m)   Wt 147 lb (66.7 kg)   LMP 04/10/2017   SpO2 99%   BMI 25.23 kg/m   Wt Readings from Last 3 Encounters:  04/10/17 147 lb (66.7 kg)  03/04/17 148 lb 6.4 oz (67.3 kg)  12/26/16 145 lb (65.8 kg)    Physical Exam  General - healthy, well-appearing, NAD HEENT - Normocephalic, atraumatic Neck - supple, non-tender, no LAD, no thyromegaly Heart - RRR, no murmurs heard Lungs - Clear throughout all lobes, no wheezing, crackles, or rhonchi. Normal work of breathing. Extremeties - non-tender, no edema, cap refill < 2 seconds, peripheral pulses intact +2 bilaterally Skin - warm, dry Neuro - awake, alert, oriented x3, CN II-X intact, mild L eye photophobia, intact muscle strength 5/5 bilaterally, intact distal sensation to light touch, normal coordination, normal gait Psych - Normal mood and affect, normal  behavior    Results for orders placed or performed in visit on 03/04/17  Thyroid Panel With TSH  Result Value Ref Range   TSH 1.670 0.450 - 4.500 uIU/mL   T4, Total 10.2 4.5 - 12.0 ug/dL   T3 Uptake Ratio 20 (L) 24 - 39 %   Free Thyroxine Index 2.0 1.2 - 4.9  CBC  Result Value Ref Range   WBC 5.6 3.4 - 10.8 x10E3/uL   RBC 4.67 3.77 - 5.28 x10E6/uL   Hemoglobin 12.5 11.1 - 15.9 g/dL   Hematocrit 16.1 09.6 - 46.6 %   MCV 78 (L) 79 - 97 fL   MCH 26.8 26.6 - 33.0 pg   MCHC 34.3 31.5 - 35.7 g/dL   RDW 04.5 40.9 - 81.1 %   Platelets 307 150 - 379 x10E3/uL  Ferritin  Result Value Ref Range   Ferritin 9 (L) 15 - 150 ng/mL    Comprehensive metabolic panel  Result Value Ref Range   Glucose 82 65 - 99 mg/dL   BUN 10 6 - 20 mg/dL   Creatinine, Ser 9.14 0.57 - 1.00 mg/dL   GFR calc non Af Amer 95 >59 mL/min/1.73   GFR calc Af Amer 109 >59 mL/min/1.73   BUN/Creatinine Ratio 12 9 - 23   Sodium 141 134 - 144 mmol/L   Potassium 3.9 3.5 - 5.2 mmol/L   Chloride 106 96 - 106 mmol/L   CO2 23 20 - 29 mmol/L   Calcium 8.8 8.7 - 10.2 mg/dL   Total Protein 7.0 6.0 - 8.5 g/dL   Albumin 4.2 3.5 - 5.5 g/dL   Globulin, Total 2.8 1.5 - 4.5 g/dL   Albumin/Globulin Ratio 1.5 1.2 - 2.2   Bilirubin Total <0.2 0.0 - 1.2 mg/dL   Alkaline Phosphatase 79 39 - 117 IU/L   AST 15 0 - 40 IU/L   ALT 11 0 - 32 IU/L  Vitamin D (25 hydroxy)  Result Value Ref Range   Vit D, 25-Hydroxy 30.4 30.0 - 100.0 ng/mL  B12 and Folate Panel  Result Value Ref Range   Vitamin B-12 331 232 - 1,245 pg/mL   Folate 14.9 >3.0 ng/mL  Celiac Panel  Result Value Ref Range   Antigliadin Abs, IgA 5 0 - 19 units   Gliadin IgG 3 0 - 19 units   Transglutaminase IgA <2 0 - 3 U/mL      Assessment & Plan:   Problem List Items Addressed This Visit      Digestive   Colitis   Acid reflux   Esophageal dysphagia Pt with long-standing relationship with Gastroenterology for management of multiple GI conditions and chronic abdominal pain.  Pt requests referral and evaluation of appropriateness of specialist care for new insurance plan.   Referral placed to maintain relationship with Gastroenterology.   Relevant Orders   Ambulatory referral to Gastroenterology    Other Visit Diagnoses    Intractable migraine without status migrainosus, unspecified migraine type    -  Primary Consistent with migraine HA x 2 days, unilateral localized to left side of head, likely trigger stress or lack of sleep - Currently with mild, resolving HA, well-appearing, no focal neuro deficits, tolerating PO w/o n/v - Inadequately treated for migraine HA at home and no use of  sumatriptan or other prescription therapies in past. - Given abdominal symptoms, could be experiencing abdominal migraine.  Will followup after sumatriptan for additional symptom review before diagnosis.  Plan: 1. Start abortive therapy with Sumatriptan 50mg   tabs - take 1 PRN, severe HA, may repeat dose within 2 hr if persistent, no more than 3 doses in 24 hours, in future can titrate dose to 50-100mg  if needed. Counseling on potential side effect / intolerance with chest discomfort acutely after taking sumatriptan 2. Recommend taking Ibuprofen 600-800mg  q 8 hr PRN, and can try Tylenol 1000mg  TID alternatively 3. Avoid triggers including foods, caffeine. Important to rest. - Discussion on future migraine prophylaxis medications.  Keep headache log for determining frequency. 4. Return criteria given for acute migraine, when to go to office vs ED   Relevant Medications   SUMAtriptan (IMITREX) 50 MG tablet    History of ovarian cyst     Pt with history of ovarian cyst currently monitored by OB-GYN.  Pt on contraception that has been managed by them to provide continuous active pills.  Continue care with specialist.  Referral placed.   Relevant Orders   Ambulatory referral to Obstetrics / Gynecology      Meds ordered this encounter  Medications  . SUMAtriptan (IMITREX) 50 MG tablet    Sig: Take 1 tablet at onset of headache May repeat every 2 hours x 3 total doses in 24 hours if headache persists or returns.    Dispense:  10 tablet    Refill:  0    Order Specific Question:   Supervising Provider    Answer:   Smitty CordsKARAMALEGOS, ALEXANDER J [[id number]]   Follow up plan: Return in about 6 months (around 10/08/2017) for annual physical.  Wilhelmina McardleLauren Shakeyla Giebler, DNP, AGPCNP-BC Adult Gerontology Primary Care Nurse Practitioner [facility] 04/11/2017, 5:06 PM

## 2017-04-11 ENCOUNTER — Encounter: Payer: Self-pay | Admitting: Nurse Practitioner

## 2017-04-21 ENCOUNTER — Encounter: Payer: Self-pay | Admitting: Gastroenterology

## 2017-04-21 ENCOUNTER — Ambulatory Visit (INDEPENDENT_AMBULATORY_CARE_PROVIDER_SITE_OTHER): Payer: No Typology Code available for payment source | Admitting: Gastroenterology

## 2017-04-21 VITALS — BP 118/68 | HR 101 | Ht 64.0 in | Wt 149.0 lb

## 2017-04-21 DIAGNOSIS — R1310 Dysphagia, unspecified: Secondary | ICD-10-CM

## 2017-04-21 DIAGNOSIS — R1319 Other dysphagia: Secondary | ICD-10-CM

## 2017-04-21 MED ORDER — METOCLOPRAMIDE HCL 5 MG PO TABS: 5.0000 mg | ORAL_TABLET | Freq: Four times a day (QID) | ORAL | 3 refills | Status: DC

## 2017-04-21 NOTE — Progress Notes (Signed)
Primary Care Physician: Galen ManilaKennedy, Lauren Renee, NP  Primary Gastroenterologist:  Dr. Midge Miniumarren Niah Heinle  Chief Complaint  Patient presents with  . Gastroesophageal Reflux  . Dysphagia    HPI: Carol Conley is a 27 y.o. female here for follow-up.  The patient has a history of dysphagia and had dilation of her esophagus back in October 2018.  The patient also was found to have a significant amount of food in her stomach at that time and was set up for a gastric emptying study.  The patient was found to have delayed gastric emptying at 4 hours.  The patient now has been treated for irritable bowel syndrome and with Reglan. The patient reports that she is having only intermittent problems with swallowing.  She states that she wanted come sooner than later.  Current Outpatient Medications  Medication Sig Dispense Refill  . [name];[name] Inhale 1-2 puffs into the lungs every 6 (six) hours as needed for wheezing or shortness of breath. Or cough 1 Inhaler 11  . aspirin-acetaminophen-caffeine (EXCEDRIN MIGRAINE) 250-250-65 MG per tablet Take 2 tablets by mouth every 8 (eight) hours as needed for migraine.     Marland Kitchen. dexlansoprazole ([name]) 60 MG capsule Take 1 capsule (60 mg total) by mouth daily. 60 capsule 11  . [name] TAKE 1 CAPSULE BY MOUTH 4 TIMES DAILY - BEFORE MEALS AND AT BEDTIME. 90 capsule 3  . [name] Take 180 mg by mouth daily. As needed    . fluticasone (FLONASE) 50 MCG/ACT nasal spray Place 2 sprays into both nostrils daily. (Patient taking differently: Place 2 sprays into both nostrils daily. As needed) 16 g 2  . [name] Place 1 suppository (25 mg total) rectally 2 (two) times daily. (Patient taking differently: Place 25 mg rectally 2 (two) times daily as needed. ) 14 suppository 2  . [name] 1-20 MG-MCG tablet Take 1 tablet by mouth  daily. 4 Package 4  . Lidocaine, [name], [name] Relief of anorectal pain and itching: Apply small, pea-sized amount of cream to affected area up to 6 times daily as needed 30 g 1  . [name] Take 1 tablet (5 mg total) by mouth 4 (four) times daily. 60 tablet 3  . [name] TAKE 1 TABLET BY MOUTH EVERY 8 HOURS AS NEEDED FOR NAUSEA OR VOMITING. 30 tablet 5  . Pediatric Multivit-Minerals-C (KIDS GUMMY BEAR VITAMINS PO) Take by mouth.    . [name] Take by mouth.    . [name] Take 1 tablet (25 mg total) by mouth every 6 (six) hours as needed for nausea or vomiting. 30 tablet 6  . SUMAtriptan (IMITREX) 50 MG tablet Take 1 tablet at onset of headache May repeat every 2 hours x 3 total doses in 24 hours if headache persists or returns. 10 tablet 0  . [name] Place 1 application rectally 2 (two) times daily as needed for hemorrhoids. (Patient not taking: Reported on 04/10/2017) 30 g 0   No current facility-administered medications for this visit.     Allergies as of 04/21/2017 - Review Complete 04/21/2017  Allergen Reaction Noted  . Banana Itching 04/05/2016  . [name] Hives 08/10/2014  . Cantaloupe (diagnostic) Itching 04/05/2016  . [name] Hives 08/10/2014  ROS:  General: Negative for anorexia, weight loss, fever, chills, fatigue, weakness. ENT: Negative for hoarseness, difficulty swallowing , nasal congestion. CV: Negative for chest pain, angina, palpitations, dyspnea on exertion, peripheral edema.  Respiratory: Negative for dyspnea at rest, dyspnea on exertion, cough, sputum, wheezing.  GI: See history of present illness. GU:  Negative for dysuria, hematuria, urinary incontinence, urinary frequency, nocturnal urination.  Endo: Negative for  unusual weight change.    Physical Examination:   BP 118/68   Pulse (!) 101   Ht 5\' 4"  (1.626 m)   Wt 149 lb (67.6 kg)   LMP 04/10/2017   BMI 25.58 kg/m   General: Well-nourished, well-developed in no acute distress.  Eyes: No icterus. Conjunctivae pink. Mouth: Oropharyngeal mucosa moist and pink , no lesions erythema or exudate. Lungs: Clear to auscultation bilaterally. Non-labored. Heart: Regular rate and rhythm, no murmurs rubs or gallops.  Abdomen: Bowel sounds are normal, nontender, nondistended, no hepatosplenomegaly or masses, no abdominal bruits or hernia , no rebound or guarding.   Extremities: No lower extremity edema. No clubbing or deformities. Neuro: Alert and oriented x 3.  Grossly intact. Skin: Warm and dry, no jaundice.   Psych: Alert and cooperative, normal mood and affect.  Labs:    Imaging Studies: No results found.  Assessment and Plan:   Carol Conley is a 27 y.o. y/o female with a history of dysphagia and delayed gastric emptying. The patient had an upper endoscopy back in October 2018 with dilation of the esophagus. The patient reports that her swallowing is not so bad that she wants to be set up for an upper endoscopy at this time.  The patient has been told that when her symptoms started to become bothersome she should call our office and make an appointment for a dilation rather than coming into the office to be seen.  The patient has been explained the plan and agrees with it.    [name], MD. [facility]   Note: This dictation was prepared with Dragon dictation along with smaller phrase technology. Any transcriptional errors that result from this process are unintentional.

## 2017-06-09 ENCOUNTER — Ambulatory Visit (INDEPENDENT_AMBULATORY_CARE_PROVIDER_SITE_OTHER): Payer: Self-pay | Admitting: Family Medicine

## 2017-06-09 VITALS — BP 110/84 | HR 101 | Temp 98.0°F | Wt 147.0 lb

## 2017-06-09 DIAGNOSIS — R52 Pain, unspecified: Secondary | ICD-10-CM

## 2017-06-09 DIAGNOSIS — R69 Illness, unspecified: Principal | ICD-10-CM

## 2017-06-09 DIAGNOSIS — J111 Influenza due to unidentified influenza virus with other respiratory manifestations: Secondary | ICD-10-CM

## 2017-06-09 LAB — POCT INFLUENZA A/B
Influenza A, POC: NEGATIVE
Influenza B, POC: NEGATIVE

## 2017-06-09 LAB — POCT RAPID STREP A (OFFICE): RAPID STREP A SCREEN: NEGATIVE

## 2017-06-09 MED ORDER — OSELTAMIVIR PHOSPHATE 75 MG PO CAPS: 75.0000 mg | ORAL_CAPSULE | Freq: Two times a day (BID) | ORAL | 0 refills | Status: AC

## 2017-06-09 NOTE — Patient Instructions (Signed)

## 2017-06-09 NOTE — Progress Notes (Signed)
Per patient stated that she has been exposed to strep and flu

## 2017-06-09 NOTE — Progress Notes (Signed)
Carol Conley is a 27 y.o. 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 She reports 2 days of symptoms of severe body aches, chills and fever.   Review of Systems  Constitutional: Positive for chills, diaphoresis, fever and malaise/fatigue.  HENT: Positive for sore throat.   Eyes: Negative.   Respiratory: Positive for cough. Negative for sputum production.   Cardiovascular: Negative.   Gastrointestinal: Negative.   Genitourinary: Negative.   Musculoskeletal: Positive for myalgias.  Skin: Negative.   Neurological: Negative.   Endo/Heme/Allergies: Negative.   Psychiatric/Behavioral: Negative.     O: Vitals:   06/09/17 1119  BP: 110/84  Pulse: (!) 101  Temp: 98 F (36.7 C)  SpO2: 98%   Physical Exam  Constitutional: She is oriented to person, place, and time. She appears well-developed and well-nourished. She appears ill.  HENT:  Head: Normocephalic.  Right Ear: Hearing, tympanic membrane, external ear and ear canal normal.  Left Ear: Hearing, tympanic membrane, external ear and ear canal normal.  Nose: Nose normal. No rhinorrhea.  Mouth/Throat: Uvula is midline and oropharynx is clear and moist.  Neck: Normal range of motion. Neck supple.  Cardiovascular: Regular rhythm. Tachycardia present.  Pulmonary/Chest: Effort normal and breath sounds normal.  Abdominal: Soft. Bowel sounds are normal.  Neurological: She is alert and oriented to person, place, and time.  Skin: Skin is warm. She is diaphoretic.  Vitals reviewed.   A: 1. Influenza-like illness   2. Body aches    P: Will treat with influenza treatment dose based on history and presentation. Patient verbalized understanding and agrees with POC. Advised scheduled antipyretic for the next 48 hours and f/u on Wednesday if symptoms persist or are unimproved.  1. Body aches - POCT Influenza A/B-  - POCT rapid strep A - oseltamivir (TAMIFLU) 75 MG  capsule; Take 1 capsule (75 mg total) by mouth 2 (two) times daily for 5 days.  Results for orders placed or performed in visit on 06/09/17 (from the past 48 hour(s))  POCT rapid strep A     Status: Normal   Collection Time: 06/09/17 11:33 AM  Result Value Ref Range   Rapid Strep A Screen Negative Negative  POCT Influenza A/B     Status: Normal   Collection Time: 06/09/17 11:35 AM  Result Value Ref Range   Influenza A, POC Negative Negative   Influenza B, POC Negative Negative   2. Influenza-like illness - oseltamivir (TAMIFLU) 75 MG capsule; Take 1 capsule (75 mg total) by mouth 2 (two) times daily for 5 days.

## 2017-06-11 ENCOUNTER — Telehealth: Payer: Self-pay | Admitting: Emergency Medicine

## 2017-06-11 NOTE — Telephone Encounter (Signed)
Spoke with patient still has fatigue going on still taking tamiflu. At home resting

## 2017-06-11 NOTE — Telephone Encounter (Signed)
Patient still fatigue not feeling any better has been taking tamiflu. Last note instructs patient to follow up if unresolved.

## 2017-06-12 ENCOUNTER — Ambulatory Visit (INDEPENDENT_AMBULATORY_CARE_PROVIDER_SITE_OTHER): Payer: Self-pay | Admitting: Family Medicine

## 2017-06-12 VITALS — BP 118/80 | HR 96 | Temp 98.2°F | Wt 146.0 lb

## 2017-06-12 DIAGNOSIS — J069 Acute upper respiratory infection, unspecified: Secondary | ICD-10-CM

## 2017-06-12 DIAGNOSIS — R0981 Nasal congestion: Secondary | ICD-10-CM

## 2017-06-12 MED ORDER — ALBUTEROL SULFATE HFA 108 (90 BASE) MCG/ACT IN AERS: 2.0000 | INHALATION_SPRAY | Freq: Four times a day (QID) | RESPIRATORY_TRACT | 0 refills | Status: DC | PRN

## 2017-06-12 MED ORDER — AZELASTINE HCL 0.1 % NA SOLN: 2.0000 | Freq: Two times a day (BID) | NASAL | 0 refills | Status: DC

## 2017-06-12 MED ORDER — AMOXICILLIN-POT CLAVULANATE 875-125 MG PO TABS: 1.0000 | ORAL_TABLET | Freq: Two times a day (BID) | ORAL | 0 refills | Status: DC

## 2017-06-12 NOTE — Progress Notes (Signed)
C/O fatigue/cough and tight in chest no distress out of inhaler due to expired

## 2017-06-12 NOTE — Patient Instructions (Signed)

## 2017-06-12 NOTE — Progress Notes (Signed)
Stann Mainlandshley Nicole Lemon is a 27 y.o. female who presents for follow up after being treated for the flu despite a negative test due to know known exposure to strep and flu patients. Patient presents with continued symptoms that have progressed to nasal congestion, cough (productive), and fatigue. She reports tamilflu did improve symptoms and fever has resolved.  Review of Systems  Constitutional: Positive for malaise/fatigue.  HENT: Positive for congestion and sinus pain.   Eyes: Negative.   Respiratory: Positive for cough and sputum production.   Cardiovascular: Negative.   Gastrointestinal: Negative.   Genitourinary: Negative.   Musculoskeletal: Negative.   Neurological: Negative.   Endo/Heme/Allergies: Negative.   Psychiatric/Behavioral: Negative.     O: Vitals:   06/12/17 1135  BP: 118/80  Pulse: 96  Temp: 98.2 F (36.8 C)  SpO2: 98%   Physical Exam  Constitutional: She is oriented to person, place, and time. Vital signs are normal. She appears well-developed and well-nourished.  HENT:  Head: Normocephalic.  Right Ear: Hearing, tympanic membrane, external ear and ear canal normal.  Left Ear: Hearing, tympanic membrane, external ear and ear canal normal.  Nose: Right sinus exhibits maxillary sinus tenderness and frontal sinus tenderness. Left sinus exhibits maxillary sinus tenderness and frontal sinus tenderness.  Neck: Normal range of motion. Neck supple.  Cardiovascular: Normal rate and regular rhythm.  Pulmonary/Chest: Effort normal and breath sounds normal.  Abdominal: Soft. Bowel sounds are normal.  Musculoskeletal: Normal range of motion.  Lymphadenopathy:       Head (right side): No submental, no submandibular and no tonsillar adenopathy present.       Head (left side): No submental, no submandibular and no tonsillar adenopathy present.    She has cervical adenopathy.       Right cervical: Deep cervical adenopathy present.       Left cervical: Deep cervical adenopathy  present.  Neurological: She is alert and oriented to person, place, and time.  Skin: Skin is warm.    A: 1. Sinus congestion   2. Viral upper respiratory tract infection    P: Diagnosis and medication use and indications discussed. Work note for 48 hours provided. Patient agrees with POC at this time.  1. Sinus congestion - amoxicillin-clavulanate (AUGMENTIN) 875-125 MG tablet; Take 1 tablet by mouth 2 (two) times daily. - azelastine (ASTELIN) 0.1 % nasal spray; Place 2 sprays into both nostrils 2 (two) times daily. Use in each nostril as directed  2. Viral upper respiratory tract infection - [name];[name]; Inhale 2 puffs into the lungs every 6 (six) hours as needed for wheezing or shortness of breath.

## 2017-06-20 ENCOUNTER — Other Ambulatory Visit: Payer: Self-pay | Admitting: Gastroenterology

## 2017-10-06 ENCOUNTER — Encounter: Payer: Self-pay | Admitting: Nurse Practitioner

## 2017-10-06 ENCOUNTER — Ambulatory Visit (INDEPENDENT_AMBULATORY_CARE_PROVIDER_SITE_OTHER): Payer: BC Managed Care – PPO | Admitting: Nurse Practitioner

## 2017-10-06 ENCOUNTER — Other Ambulatory Visit: Payer: Self-pay

## 2017-10-06 VITALS — BP 108/62 | HR 95 | Temp 98.7°F | Ht 64.0 in | Wt 153.2 lb

## 2017-10-06 DIAGNOSIS — K582 Mixed irritable bowel syndrome: Secondary | ICD-10-CM | POA: Diagnosis not present

## 2017-10-06 DIAGNOSIS — G4700 Insomnia, unspecified: Secondary | ICD-10-CM

## 2017-10-06 MED ORDER — MELATONIN 1 MG PO TABS: 1.0000 mg | ORAL_TABLET | Freq: Every day | ORAL | Status: DC

## 2017-10-06 MED ORDER — PSYLLIUM 58.6 % PO POWD: 0.2500 | Freq: Three times a day (TID) | ORAL | 12 refills | Status: DC

## 2017-10-06 MED ORDER — PSYLLIUM 58.6 % PO POWD: ORAL | 12 refills | Status: DC

## 2017-10-06 MED ORDER — PEPPERMINT OIL 90 MG PO CPCR: 1.0000 | ORAL_CAPSULE | Freq: Every day | ORAL | Status: DC

## 2017-10-06 NOTE — Progress Notes (Signed)
Subjective:    Patient ID: Carol Conley, female    DOB: 1990/10/28, 27 y.o.   MRN: [id number]  Carol Conley is a 27 y.o. female presenting on 10/06/2017 for Insomnia (worsened by work schedule) and Irritable Bowel Syndrome   HPI  Insomnia Patient has been feeling well.  They have no acute concerns today. Sleeps 6-7 hours per night interrupted . Poor sleep, very tired - even on vacation.  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  She is required to work 6 shifts on nights in a 6 week schedule. This results in 2 of 6 weeks on nights.  Patient has grouped these together and spread them out.  None is any better than the other.  Social connection suffers more when grouped together, so patient generally avoids this.  She has always had difficulty sleeping per her report, but is worsened with shift work.  Melatonin in past (5 mg dose) has caused vivid dreams.  Otherwise, has not used sleep medications.  Patient does not wish to start sleep meds at this time.  She reports extreme fatigue, never feeling rested after sleep even when sleeping for greater lengths of time or during regular nighttime sleep hours.  She nearly fell asleep this weekend after lunch sitting at a picnic table with other people.   Epworth Sleepiness Scale Patient's Answer Chance of dozing off under normal circumstances  Sitting and reading  3   Watching TV 3 0 = Never  Sitting inactive in a public place 1 1 = Slight chance  As a passenger in a car for an hour without a break 2 2 = Moderate chance  Lying down to rest in the afternoon when circumstances permit 3 3 = High chance  Sitting and talking to someone 1   Sitting quietly after a lunch without alcohol 3   In a car, while stopped for a few minutes in traffic 0                                                              Total Score: 16    0-7: It is unlikely that you are abnormally sleepy. 8-9: You have an average amount of daytime sleepiness. >9: POSITIVE - Recommend  further evaluation, sleep specialist or sleep study (>16-24 = severe)   IBS Patient has chronic history of IBS/abdominal pain/cramping.  No pattern for IBS constipation/diarrhea.  Dehydration, but was having diarrhea instead.  Reglan prn - once every 2 days. Dicyclomine prn for cramping.  She has seen Dr. Servando Snare and has tried Linzess or Amitiza in past that caused worsening diarrhea.  She has also tried TCA without success.  Not currently using fiber based medications for symptoms and has not taken these with any regularity in past.   Past Medical History:  Diagnosis Date  . Allergy   . Asthma    as child  . Colitis   . GERD (gastroesophageal reflux disease)   . Headache    sinus  . IBS (irritable bowel syndrome)   . IgA deficiency, selective (HCC)   . Motion sickness    cars, planes  . Sinusitis    Past Surgical History:  Procedure Laterality Date  . ESOPHAGEAL DILATION  04/08/2016   Procedure: ESOPHAGEAL DILATION;  Surgeon: [name], MD;  Location: [name];  Service: Endoscopy;;  . ESOPHAGEAL DILATION  12/26/2016   Procedure: ESOPHAGEAL DILATION;  Surgeon: Midge MiniumWohl, Darren, MD;  [facility];  Service: Endoscopy;;  . ESOPHAGOGASTRODUODENOSCOPY (EGD) WITH PROPOFOL N/A 04/08/2016   Procedure: ESOPHAGOGASTRODUODENOSCOPY (EGD) WITH PROPOFOL;  Surgeon: Midge Miniumarren Wohl, MD;  [facility];  Service: Endoscopy;  Laterality: N/A;  . ESOPHAGOGASTRODUODENOSCOPY (EGD) WITH PROPOFOL N/A 12/26/2016   Procedure: ESOPHAGOGASTRODUODENOSCOPY (EGD) WITH PROPOFOL;  Surgeon: Midge MiniumWohl, Darren, MD;  [facility];  Service: Endoscopy;  Laterality: N/A;  UPREG  . OVARIAN CYST REMOVAL Right    2007  . [facility]     Social History   Socioeconomic History  . [name]    Spouse name: Not on file  . Number of children: Not on file  . Years of education: Not on file  . Highest education level: Not on file  Occupational History  . Not  on file  Social Needs  . Financial resource strain: Not on file  . Food insecurity:    Worry: Not on file    Inability: Not on file  . Transportation needs:    Medical: Not on file    Non-medical: Not on file  Tobacco Use  . Smoking status: Never Smoker  . Smokeless tobacco: Never Used  Substance and Sexual Activity  . Alcohol use: Yes    Comment: occasionally   . [name]  . Sexual activity: Never    Birth control/protection: Pill  Lifestyle  . Physical activity:    Days per week: Not on file    Minutes per session: Not on file  . Stress: Not on file  Relationships  . Social connections:    Talks on phone: Not on file    Gets together: Not on file    Attends religious service: Not on file    Active member of club or organization: Not on file    Attends meetings of clubs or organizations: Not on file    Relationship status: Not on file  . Intimate partner violence:    Fear of current or ex partner: Not on file    Emotionally abused: Not on file    Physically abused: Not on file    Forced sexual activity: Not on file  Other Topics Concern  . Not on file  Social History Narrative  . Not on file   Family History  Problem Relation Age of Onset  . Cancer Father        skin  . Thyroid disease Mother    Current Outpatient Medications on File Prior to Visit  Medication Sig  . [name];[name] Inhale 2 puffs into the lungs every 6 (six) hours as needed for wheezing or shortness of breath.  Marland Kitchen. aspirin-acetaminophen-caffeine (EXCEDRIN MIGRAINE) 250-250-65 MG per tablet Take 2 tablets by mouth every 8 (eight) hours as needed for migraine.   Marland Kitchen. [name] 60 MG capsule TAKE 1 CAPSULE (60 MG TOTAL) BY MOUTH DAILY.  Marland Kitchen. [name] TAKE 1 CAPSULE BY MOUTH 4 TIMES DAILY - BEFORE MEALS AND AT BEDTIME.  . [name] Take 180 mg by mouth daily. As needed  . fluticasone (FLONASE) 50  MCG/ACT nasal spray Place 2 sprays into both nostrils daily. (Patient taking differently: Place 2 sprays into both nostrils daily. As needed)  . [name] 1-20 MG-MCG tablet Take 1 tablet by mouth daily.  .Marland Kitchen  Lidocaine, [name], [name] Relief of anorectal pain and itching: Apply small, pea-sized amount of cream to affected area up to 6 times daily as needed  . [name] Take 1 tablet (5 mg total) by mouth 4 (four) times daily.  . [name] TAKE 1 TABLET BY MOUTH EVERY 8 HOURS AS NEEDED FOR NAUSEA OR VOMITING.  Marland Kitchen Pediatric Multivit-Minerals-C (KIDS GUMMY BEAR VITAMINS PO) Take by mouth.  . [name] Take 1 tablet (25 mg total) by mouth every 6 (six) hours as needed for nausea or vomiting.  . SUMAtriptan (IMITREX) 50 MG tablet Take 1 tablet at onset of headache May repeat every 2 hours x 3 total doses in 24 hours if headache persists or returns.  . [name] Place 1 suppository (25 mg total) rectally 2 (two) times daily. (Patient not taking: Reported on 10/06/2017)  . [name] Place 1 application rectally 2 (two) times daily as needed for hemorrhoids. (Patient not taking: Reported on 10/06/2017)  . [name] Take by mouth.   No current facility-administered medications on file prior to visit.     Review of Systems Per HPI unless specifically indicated above     Objective:    BP 108/62 (BP Location: Right Arm, Patient Position: Sitting, Cuff Size: Normal)   Pulse 95   Temp 98.7 F (37.1 C) (Oral)   Ht 5\' 4"  (1.626 m)   Wt 153 lb 3.2 oz (69.5 kg)   LMP 09/23/2017 (Approximate)   BMI 26.30 kg/m   Wt Readings from Last 3 Encounters:  10/06/17 153 lb 3.2 oz (69.5 kg)  06/12/17 146 lb (66.2 kg)  06/09/17 147 lb (66.7 kg)    Physical Exam   Constitutional: She is oriented to person, place, and time. She appears well-developed and well-nourished. No distress.  Appears fatigued  HENT:  Head: Normocephalic and atraumatic.  Cardiovascular: Normal rate, regular rhythm, S1 normal, S2 normal, normal heart sounds and intact distal pulses.  Pulmonary/Chest: Effort normal and breath sounds normal. No respiratory distress.  Abdominal: Soft. Normal appearance. She exhibits no distension. Bowel sounds are increased. There is no hepatosplenomegaly. There is generalized tenderness. There is no rigidity, no rebound, no guarding, no CVA tenderness and no tenderness at McBurney's point. No hernia.  Neurological: She is alert and oriented to person, place, and time.  Skin: Skin is warm and dry. Capillary refill takes less than 2 seconds.  Psychiatric: She has a normal mood and affect. Her behavior is normal. Judgment and thought content normal.  Vitals reviewed.   Results for orders placed or performed in visit on 06/09/17  POCT Influenza A/B  Result Value Ref Range   Influenza A, POC Negative Negative   Influenza B, POC Negative Negative  POCT rapid strep A  Result Value Ref Range   Rapid Strep A Screen Negative Negative      Assessment & Plan:   Problem List Items Addressed This Visit    None    Visit Diagnoses    Insomnia, unspecified type    -  Primary Chronic insomnia, unknown cause, but is possible to have sleep disorder with symptoms of regular awakenings through sleep cycle.  Worsened by rotating shift-work sleep. - Encouraged good sleep hygiene, including stress management strategies to appropriately process stressful work shifts prior to sleep. - Referral sleep medicine - evaluate for sleep disorder.  No sleep study at this time unless ordered by sleep medicine. - START melatonin 1 mg 1 hour before bed.  May use 2 mg if tolerated and if needed.  Side effects could have been dose related - Followup prn after sleep medicine  referral    Relevant Medications   Melatonin 1 MG TABS   Other Relevant Orders   Ambulatory referral to Sleep Studies   Irritable bowel syndrome with both constipation and diarrhea     Stable, but uncontrolled symptoms. - Continue all current medications. - START daily metamucil 1/4 to 1/2 dose every day for constipation and diarrhea. - START IBgard daily.  If helpful over next 30 days, may continue.  Cautioned patient against worsening of gastroparesis/reflux.  IF this occurs, instructed to stop use. - Followup 3 months.  Continue GI prn.   Relevant Medications   Peppermint Oil (IBGARD) 90 MG CPCR   psyllium (METAMUCIL MULTIHEALTH FIBER) 58.6 % powder      Meds ordered this encounter  Medications  . Peppermint Oil (IBGARD) 90 MG CPCR    Sig: Take 1 capsule by mouth daily.    Order Specific Question:   Supervising Provider    Answer:   Smitty Cords [[id number]]  . psyllium (METAMUCIL MULTIHEALTH FIBER) 58.6 % powder    Sig: Take 1/4 dose daily for bowel regularity.  After 2 weeks, increase to 1/2 dose if needed.    Dispense:  283 g    Refill:  12    Order Specific Question:   Supervising Provider    Answer:   Smitty Cords [[id number]]  . Melatonin 1 MG TABS    Sig: Take 1-2 tablets (1-2 mg total) by mouth at bedtime.    Order Specific Question:   Supervising Provider    Answer:   Smitty Cords [[id number]]     Follow up plan: Return in about 2 weeks (around 10/20/2017) for annual physical.  Wilhelmina Mcardle, DNP, AGPCNP-BC Adult Gerontology Primary Care Nurse Practitioner [facility] 10/06/2017, 12:35 PM

## 2017-10-06 NOTE — Patient Instructions (Addendum)
Pablo LedgerAshley Nicole Iott,   Thank you for coming in to clinic today.  1. Take 1/4 dose metamucil or benefiber daily.  Drink plenty of water every day. - START IBgard daily for 2-4 weeks.  If not helpful with symptoms, you may stop.   2.  Keep sleep routines. - Take melatonin 1-2 mg every day/night about 1 hour prior to sleep onset.   Please schedule a follow-up appointment with Wilhelmina McardleLauren Aleah Ahlgrim, AGNP. Return in about 2 weeks (around 10/20/2017) for annual physical.  If you have any other questions or concerns, please feel free to call the clinic or send a message through MyChart. You may also schedule an earlier appointment if necessary.  You will receive a survey after today's visit either digitally by e-mail or paper by [employer] mail. Your experiences and feedback matter to us.  Please respond so we know how we are doing as we provide care for you.   Wilhelmina McardleLauren Temari Schooler, DNP, AGNP-BC Adult Gerontology Nurse Practitioner [facility], [facility]

## 2017-10-07 ENCOUNTER — Encounter: Payer: Self-pay | Admitting: Nurse Practitioner

## 2017-10-23 ENCOUNTER — Encounter: Payer: BC Managed Care – PPO | Admitting: Nurse Practitioner

## 2017-10-24 ENCOUNTER — Ambulatory Visit (INDEPENDENT_AMBULATORY_CARE_PROVIDER_SITE_OTHER): Payer: BC Managed Care – PPO | Admitting: Nurse Practitioner

## 2017-10-24 ENCOUNTER — Encounter: Payer: Self-pay | Admitting: Nurse Practitioner

## 2017-10-24 ENCOUNTER — Other Ambulatory Visit: Payer: Self-pay

## 2017-10-24 VITALS — BP 105/62 | HR 93 | Temp 98.4°F | Ht 64.0 in | Wt 150.6 lb

## 2017-10-24 DIAGNOSIS — R790 Abnormal level of blood mineral: Secondary | ICD-10-CM

## 2017-10-24 DIAGNOSIS — G4726 Circadian rhythm sleep disorder, shift work type: Secondary | ICD-10-CM | POA: Diagnosis not present

## 2017-10-24 DIAGNOSIS — Z0000 Encounter for general adult medical examination without abnormal findings: Secondary | ICD-10-CM | POA: Diagnosis not present

## 2017-10-24 DIAGNOSIS — Z23 Encounter for immunization: Secondary | ICD-10-CM

## 2017-10-24 NOTE — Progress Notes (Signed)
Subjective:    Patient ID: Carol Conley, female    DOB: 04/28/1990, 27 y.o.   MRN: [id number]  Carol Conley is a 27 y.o. female presenting on 10/24/2017 for Annual Exam   HPI  Annual Physical Exam Patient has been feeling well.  They have continued fatigue concerns today. Sleep continues to be very interrupted.  Swing shifting.  Melatonin helping.  HEALTH MAINTENANCE: Weight/BMI: Stable Physical activity: regular Diet: generally healthy Seatbelt: always Sunscreen: regularly PAP: up to date HIV/HEP C: never - never sexually active.  Negative work screening after needlestick. Optometry: every 2-3 years Dentistry: not regular  VACCINES:  Tetanus/Tdap: due today, unknown date and was at least 8-10 years ago.  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 Influenza: gets at work Gardasil: received  Shift work sleep disorder Patient continues to have swing shift expectations with her job as a Engineer, civil (consulting)nurse.  Carol Conley is working 12 hours days 6 12-hour night shifts per 6 weeks. - Melatonin 2.5 mg is helping, but is wearing off after about 3-4 hours of sleep.   - Patient is now having difficulty getting adequate sleep at night when working day shift. - Patient admits that her fatigue is making her feel unsafe in her nursing care due to "grogginess." - Patient continues to keep regular bedtime routine with lavender, lotion, self care.   Past Medical History:  Diagnosis Date  . Allergy   . Asthma    as child  . Colitis   . GERD (gastroesophageal reflux disease)   . Headache    sinus  . IBS (irritable bowel syndrome)   . IgA deficiency, selective (HCC)   . Motion sickness    cars, planes  . Sinusitis    Past Surgical History:  Procedure Laterality Date  . ESOPHAGEAL DILATION  04/08/2016   Procedure: ESOPHAGEAL DILATION;  Surgeon: Midge Miniumarren Wohl, MD;  [facility];  Service: Endoscopy;;  . ESOPHAGEAL DILATION  12/26/2016   Procedure: ESOPHAGEAL DILATION;  Surgeon: Midge MiniumWohl, Darren, MD;  [facility];  Service: Endoscopy;;  . ESOPHAGOGASTRODUODENOSCOPY (EGD) WITH PROPOFOL N/A 04/08/2016   Procedure: ESOPHAGOGASTRODUODENOSCOPY (EGD) WITH PROPOFOL;  Surgeon: Midge Miniumarren Wohl, MD;  [facility];  Service: Endoscopy;  Laterality: N/A;  . ESOPHAGOGASTRODUODENOSCOPY (EGD) WITH PROPOFOL N/A 12/26/2016   Procedure: ESOPHAGOGASTRODUODENOSCOPY (EGD) WITH PROPOFOL;  Surgeon: Midge MiniumWohl, Darren, MD;  [facility];  Service: Endoscopy;  Laterality: N/A;  UPREG  . OVARIAN CYST REMOVAL Right    2007  . [facility]     Social History   Socioeconomic History  . [name]    Spouse name: Not on file  . Number of children: Not on file  . Years of education: Not on file  . Highest education level: Not on file  Occupational History  . Not on file  Social Needs  . Financial resource strain: Not on file  . Food insecurity:    Worry: Not on file    Inability: Not on file  . Transportation needs:    Medical: Not on file    Non-medical: Not on file  Tobacco Use  . Smoking status: Never Smoker  . Smokeless tobacco: Never Used  Substance and Sexual Activity  . Alcohol use: Yes    Comment: occasionally   . [name]  . Sexual activity: Never    Birth control/protection: Pill  Lifestyle  . Physical activity:    Days per week: Not on  file    Minutes per session: Not on file  . Stress: Not on file  Relationships  . Social connections:    Talks on phone: Not on file    Gets together: Not on file    Attends religious service: Not on file    Active member of club or organization: Not on file    Attends meetings of clubs or organizations: Not on file    Relationship status: Not on file  . Intimate partner violence:    Fear of current or ex partner: Not on file    Emotionally abused: Not on file    Physically abused: Not on file    Forced sexual activity: Not on file    Other Topics Concern  . Not on file  Social History Narrative  . Not on file   Family History  Problem Relation Age of Onset  . Cancer Father        skin  . Thyroid disease Mother    Current Outpatient Medications on File Prior to Visit  Medication Sig  . [name];[name] Inhale 2 puffs into the lungs every 6 (six) hours as needed for wheezing or shortness of breath.  Marland Kitchen aspirin-acetaminophen-caffeine (EXCEDRIN MIGRAINE) 250-250-65 MG per tablet Take 2 tablets by mouth every 8 (eight) hours as needed for migraine.   Marland Kitchen [name] 60 MG capsule TAKE 1 CAPSULE (60 MG TOTAL) BY MOUTH DAILY.  Marland Kitchen [name] TAKE 1 CAPSULE BY MOUTH 4 TIMES DAILY - BEFORE MEALS AND AT BEDTIME.  . [name] Take 180 mg by mouth daily. As needed  . fluticasone (FLONASE) 50 MCG/ACT nasal spray Place 2 sprays into both nostrils daily. (Patient taking differently: Place 2 sprays into both nostrils daily. As needed)  . [name] 1-20 MG-MCG tablet Take 1 tablet by mouth daily.  . Melatonin 1 MG TABS Take 1-2 tablets (1-2 mg total) by mouth at bedtime.  . [name] Take 1 tablet (5 mg total) by mouth 4 (four) times daily.  . [name] TAKE 1 TABLET BY MOUTH EVERY 8 HOURS AS NEEDED FOR NAUSEA OR VOMITING.  Marland Kitchen Pediatric Multivit-Minerals-C (KIDS GUMMY BEAR VITAMINS PO) Take by mouth.  . [name] Take 1 tablet (25 mg total) by mouth every 6 (six) hours as needed for nausea or vomiting.  . psyllium (METAMUCIL MULTIHEALTH FIBER) 58.6 % powder Take 1/4 dose daily for bowel regularity.  After 2 weeks, increase to 1/2 dose if needed.  . [name] Place 1 suppository (25 mg total) rectally 2 (two) times daily. (Patient not taking: Reported on 10/06/2017)  . Lidocaine, [name], [name] Relief of  anorectal pain and itching: Apply small, pea-sized amount of cream to affected area up to 6 times daily as needed (Patient not taking: Reported on 10/24/2017)  . Peppermint Oil (IBGARD) 90 MG CPCR Take 1 capsule by mouth daily. (Patient not taking: Reported on 10/24/2017)  . [name] Place 1 application rectally 2 (two) times daily as needed for hemorrhoids. (Patient not taking: Reported on 10/06/2017)  . [name] Take by mouth.  . SUMAtriptan (IMITREX) 50 MG tablet Take 1 tablet at onset of headache May repeat every 2 hours x 3 total doses in 24 hours if headache persists or returns. (Patient not  taking: Reported on 10/24/2017)   No current facility-administered medications on file prior to visit.     Review of Systems  Constitutional: Negative for chills and fever.  HENT: Negative for congestion and sore throat.   Eyes: Negative for pain.  Respiratory: Negative for cough, shortness of breath and wheezing.   Cardiovascular: Negative for chest pain, palpitations and leg swelling.  Gastrointestinal: Negative for abdominal pain, blood in stool, constipation, diarrhea, nausea and vomiting.  Endocrine: Negative for polydipsia.  Genitourinary: Negative for dysuria, frequency, hematuria and urgency.  Musculoskeletal: Negative for back pain, myalgias and neck pain.  Skin: Negative.  Negative for rash.  Allergic/Immunologic: Negative for environmental allergies.  Neurological: Negative for dizziness, weakness and headaches.  Hematological: Does not bruise/bleed easily.  Psychiatric/Behavioral: Positive for sleep disturbance (shift work swing shifts, irregular sleep patterns). Negative for dysphoric mood and suicidal ideas. The patient is not nervous/anxious.    Per HPI unless specifically indicated above     Objective:    BP 105/62 (BP Location: Right Arm, Patient Position:  Sitting, Cuff Size: Normal)   Pulse 93   Temp 98.4 F (36.9 C) (Oral)   Ht 5\' 4"  (1.626 m)   Wt 150 lb 9.6 oz (68.3 kg)   BMI 25.85 kg/m   Wt Readings from Last 3 Encounters:  10/24/17 150 lb 9.6 oz (68.3 kg)  10/06/17 153 lb 3.2 oz (69.5 kg)  06/12/17 146 lb (66.2 kg)    Physical Exam  Constitutional: Carol Conley is oriented to person, place, and time. Carol Conley appears well-developed and well-nourished. No distress.  Visibly fatigued today  HENT:  Head: Normocephalic and atraumatic.  Right Ear: External ear normal.  Left Ear: External ear normal.  Nose: Nose normal.  Mouth/Throat: Oropharynx is clear and moist.  Eyes: Pupils are equal, round, and reactive to light. Conjunctivae are normal.  Neck: Normal range of motion. Neck supple. No JVD present. No tracheal deviation present. No thyromegaly present.  Cardiovascular: Normal rate, regular rhythm, normal heart sounds and intact distal pulses. Exam reveals no gallop and no friction rub.  No murmur heard. Pulmonary/Chest: Effort normal and breath sounds normal. No respiratory distress.  Breast - Normal exam w/ symmetric breasts, no mass, no nipple discharge, no skin changes or tenderness.   Abdominal: Soft. Bowel sounds are normal. Carol Conley exhibits no distension. There is no hepatosplenomegaly. There is tenderness in the right lower quadrant.  Musculoskeletal: Normal range of motion.  Lymphadenopathy:    Carol Conley has no cervical adenopathy.  Neurological: Carol Conley is alert and oriented to person, place, and time. No cranial nerve deficit.  Skin: Skin is warm and dry. Capillary refill takes less than 2 seconds.  Psychiatric: Carol Conley has a normal mood and affect. Her behavior is normal. Judgment and thought content normal.  Nursing note and vitals reviewed.   Results for orders placed or performed in visit on 06/09/17  POCT Influenza A/B  Result Value Ref Range   Influenza A, POC Negative Negative   Influenza B, POC Negative Negative  POCT rapid strep A    Result Value Ref Range   Rapid Strep A Screen Negative Negative      Assessment & Plan:   Problem List Items Addressed This Visit      Other   Circadian rhythm sleep disorder, shift work type    Ongoing problem without improvement.  Currently uncontrolled.  Patient started taking melatonin 2.5 mg at bedtime with some improvement in sleep quality first 4 hours.  Is maintaining adequate sleep  hygiene routine. -Reinforced sleep hygiene routine today. -Increase melatonin to 5 mg at bedtime.  Consider using new nature made short release and long release melatonin as patient discussed. - Consider ramelteon in future.  Information provided to patient. -Encourage patient to consider full daytime work with regular nighttime sleep schedule.  Will provide medical documentation of the need for a regular shift schedule if workplace needs one. -Follow-up approximately 3 months as needed.       Other Visit Diagnoses    Encounter for annual physical exam    -  Primary   Relevant Orders   Tdap vaccine greater than or equal to 7yo IM   CBC with Differential/Platelet   Ferritin   TSH   Lipid panel   COMPLETE METABOLIC PANEL WITH GFR   Need for diphtheria-tetanus-pertussis (Tdap) vaccine       Relevant Orders   Tdap vaccine greater than or equal to 7yo IM   Low ferritin       Relevant Orders   Ferritin      Physical exam with no new findings.  Well adult with no acute concerns other than ongoing sleep disorder as above.  Plan: 1. Obtain health maintenance screenings as above according to age. - Increase physical activity to 30 minutes most days of the week.  - Eat healthy diet high in vegetables and fruits; low in refined carbohydrates. - Low ferritin in past.  Recheck with bloodwork today.  Patient not able to take iron regularly due to GI disruption with IBS/mixed constipation and diarrhea. - Tdap due today.  2. Return 1 year for annual physical.  Follow up plan: Return in about 1 year  (around 10/25/2018) for annual physical.  Wilhelmina Mcardle, DNP, AGPCNP-BC Adult Gerontology Primary Care Nurse Practitioner [facility] 10/24/2017, 10:43 AM

## 2017-10-24 NOTE — Assessment & Plan Note (Signed)
Ongoing problem without improvement.  Currently uncontrolled.  Patient started taking melatonin 2.5 mg at bedtime with some improvement in sleep quality first 4 hours.  Is maintaining adequate sleep hygiene routine. -Reinforced sleep hygiene routine today. -Increase melatonin to 5 mg at bedtime.  Consider using new nature made short release and long release melatonin as patient discussed. - Consider ramelteon in future.  Information provided to patient. -Encourage patient to consider full daytime work with regular nighttime sleep schedule.  Will provide medical documentation of the need for a regular shift schedule if workplace needs one. -Follow-up approximately 3 months as needed.

## 2017-10-24 NOTE — Patient Instructions (Addendum)
Carol LedgerAshley Nicole Conley,   Thank you for coming in to clinic today.  1. Continue work on improving sleep hygiene. - try higher dose melatonin up to 6 mg daily.  2. Increase your physical activity until you are increasing your heart rate for 30 minutes on most days of the week.   3. Labs today.  Results will be released to MyChart.  Please schedule a follow-up appointment with Carol McardleLauren Punam Conley, AGNP. Return in about 1 year (around 10/25/2018) for annual physical.  If you have any other questions or concerns, please feel free to call the clinic or send a message through MyChart. You may also schedule an earlier appointment if necessary.  You will receive a survey after today's visit either digitally by e-mail or paper by [employer] mail. Your experiences and feedback matter to us.  Please respond so we know how we are doing as we provide care for you.   Carol McardleLauren [name], DNP, AGNP-BC Adult Gerontology Nurse Practitioner [facility]   If you need prescription medication for sleep, I would consider starting ramelteon.  Here is some information for you to read about prior to this future discussion. Ramelteon tablets What is this medicine? RAMELTEON (ram EL tee on) is used to treat insomnia. This medicine helps you to fall asleep. This medicine may be used for other purposes; ask your health care provider or pharmacist if you have questions. COMMON BRAND NAME(S): Rozerem What should I tell my health care provider before I take this medicine? They need to know if you have any of these conditions: -depression -history of a drug or alcohol abuse problem -liver disease -lung or breathing disease -suicidal thoughts -an unusual or allergic reaction to ramelteon, other medicines, foods, dyes, or preservatives -pregnant or trying to get pregnant -breast-feeding How should I use this medicine? Take this medicine by mouth with a glass of water. Do not break tablets; swallow whole. Follow  the directions on the prescription label. It is better to take this medicine on an empty stomach and only when you are ready for bed. Do not take your medicine more often than directed. A special MedGuide will be given to you by the pharmacist with each prescription and refill. Be sure to read this information carefully each time. Talk to your pediatrician regarding the use of this medicine in children. Special care may be needed. Overdosage: If you think you have taken too much of this medicine contact a poison control center or emergency room at once. NOTE: This medicine is only for you. Do not share this medicine with others. What if I miss a dose? This does not apply. This medicine should only be taken immediately before going to sleep. Do not take double or extra doses. What may interact with this medicine? Do not take this medicine with any of the following medications: -fluvoxamine -melatonin This medicine may also interact with the following medications: -medicines used to treat fungal infections like ketoconazole, fluconazole, or itraconazole -rifampin This list may not describe all possible interactions. Give your health care provider a list of all the medicines, herbs, non-prescription drugs, or dietary supplements you use. Also tell them if you smoke, drink alcohol, or use illegal drugs. Some items may interact with your medicine. What should I watch for while using this medicine? Visit your doctor or health care professional for regular checks on your progress. Keep a regular sleep schedule by going to bed at about the same time each night. Avoid caffeine-containing drinks in  the evening hours. Talk to your doctor if you still have trouble sleeping within 7 to 10 days of using this medicine. This may mean there is another cause for your sleep problems. After taking this medicine for sleep, you may get up out of bed while not being fully awake and do an activity that you do not know you  are doing. The next morning, you may have no memory of the event. Activities such as driving a car ("sleep-driving"), making and eating food, talking on the phone, sexual activity, and sleep-walking have been reported. Call your doctor right away if you find out you have done any of these activities. Do not take this medicine if you drink alcohol or have taken another medicine for sleep, since your risk of doing these sleep-related activities will be increased. Do not take this medicine unless you are able to stay in bed for a full night (7 to 8 hours) before you must be active again. You may have a decrease in mental alertness the day after use, even if you feel that you are fully awake. Tell your doctor if you will need to perform activities requiring full alertness, such as driving, the next day. Do not stand or sit up quickly after taking this medicine, especially if you are an older patient. This reduces the risk of dizzy or fainting spells. If you or your family notice any changes in your behavior, such as new or worsening depression, thoughts of harming yourself, anxiety, other unusual or disturbing thoughts, or memory loss, call your doctor right away. What side effects may I notice from receiving this medicine? Side effects that you should report to your doctor or health care professional as soon as possible: -allergic reactions like skin rash, itching or hives, swelling of the face, lips, or tongue -breast milk production or discharge -breathing problems -joint or muscle pain -depression, suicidal thoughts -missed monthly period (for women) -unusual activities while asleep like driving, eating, making phone calls -unusually weak or tired -worsening of insomnia Side effects that usually do not require medical attention (report to your doctor or health care professional if they continue or are bothersome): -bad taste -daytime sleepiness -decreased sex  drive -diarrhea -headache -nausea This list may not describe all possible side effects. Call your doctor for medical advice about side effects. You may report side effects to FDA at 1-800-FDA-1088. Where should I keep my medicine? Keep out of the reach of children. Store at room temperature between 15 and 30 degrees C (59 and 86 degrees F). Keep the container tightly closed. Protect from moisture and humidity. Throw away any unused medicine after the expiration date. NOTE: This sheet is a summary. It may not cover all possible information. If you have questions about this medicine, talk to your doctor, pharmacist, or health care provider.  2018 Elsevier/Gold Standard (2013-11-02 18:21:29)

## 2017-10-25 LAB — COMPLETE METABOLIC PANEL WITH GFR
AG Ratio: 1.4 (calc) (ref 1.0–2.5)
ALT: 10 U/L (ref 6–29)
AST: 16 U/L (ref 10–30)
Albumin: 4 g/dL (ref 3.6–5.1)
Alkaline phosphatase (APISO): 76 U/L (ref 33–115)
BUN: 14 mg/dL (ref 7–25)
CO2: 25 mmol/L (ref 20–32)
Calcium: 9.2 mg/dL (ref 8.6–10.2)
Chloride: 106 mmol/L (ref 98–110)
Creat: 0.91 mg/dL (ref 0.50–1.10)
GFR, Est African American: 99 mL/min/{1.73_m2} (ref >=60)
GFR, Est Non African American: 85 mL/min/{1.73_m2} (ref >=60)
Globulin: 2.9 g/dL (calc) (ref 1.9–3.7)
Glucose, Bld: 86 mg/dL (ref 65–99)
Potassium: 4 mmol/L (ref 3.5–5.3)
Sodium: 139 mmol/L (ref 135–146)
Total Bilirubin: 0.4 mg/dL (ref 0.2–1.2)
Total Protein: 6.9 g/dL (ref 6.1–8.1)

## 2017-10-25 LAB — CBC WITH DIFFERENTIAL/PLATELET
Basophils Absolute: 59 cells/uL (ref 0–200)
Basophils Relative: 0.8 %
Eosinophils Absolute: 170 cells/uL (ref 15–500)
Eosinophils Relative: 2.3 %
HCT: 37.1 % (ref 35.0–45.0)
Hemoglobin: 12.3 g/dL (ref 11.7–15.5)
Lymphs Abs: 2146 cells/uL (ref 850–3900)
MCH: 26.1 pg — ABNORMAL LOW (ref 27.0–33.0)
MCHC: 33.2 g/dL (ref 32.0–36.0)
MCV: 78.6 fL — ABNORMAL LOW (ref 80.0–100.0)
MPV: 9.3 fL (ref 7.5–12.5)
Monocytes Relative: 8 %
Neutro Abs: 4433 cells/uL (ref 1500–7800)
Neutrophils Relative %: 59.9 %
Platelets: 301 10*3/uL (ref 140–400)
RBC: 4.72 10*6/uL (ref 3.80–5.10)
RDW: 12.6 % (ref 11.0–15.0)
Total Lymphocyte: 29 %
WBC mixed population: 592 cells/uL (ref 200–950)
WBC: 7.4 10*3/uL (ref 3.8–10.8)

## 2017-10-25 LAB — LIPID PANEL
Cholesterol: 189 mg/dL (ref <200)
HDL: 62 mg/dL (ref >50)
LDL Cholesterol (Calc): 108 mg/dL (calc) — ABNORMAL HIGH
Non-HDL Cholesterol (Calc): 127 mg/dL (calc) (ref <130)
Total CHOL/HDL Ratio: 3 (calc) (ref <5.0)
Triglycerides: 95 mg/dL (ref <150)

## 2017-10-25 LAB — TSH: TSH: 1.63 mIU/L

## 2017-10-25 LAB — FERRITIN: Ferritin: 5 ng/mL — ABNORMAL LOW (ref 16–154)

## 2017-11-10 ENCOUNTER — Encounter: Payer: Self-pay | Admitting: Nurse Practitioner

## 2017-11-25 ENCOUNTER — Encounter: Payer: Self-pay | Admitting: Nurse Practitioner

## 2017-11-26 ENCOUNTER — Ambulatory Visit: Payer: BC Managed Care – PPO | Admitting: Nurse Practitioner

## 2017-11-26 ENCOUNTER — Telehealth: Payer: Self-pay | Admitting: Nurse Practitioner

## 2017-11-26 ENCOUNTER — Other Ambulatory Visit: Payer: Self-pay

## 2017-11-26 ENCOUNTER — Encounter: Payer: Self-pay | Admitting: Nurse Practitioner

## 2017-11-26 VITALS — BP 98/55 | HR 90 | Temp 98.6°F | Ht 64.0 in | Wt 151.0 lb

## 2017-11-26 DIAGNOSIS — J301 Allergic rhinitis due to pollen: Secondary | ICD-10-CM

## 2017-11-26 DIAGNOSIS — J0100 Acute maxillary sinusitis, unspecified: Secondary | ICD-10-CM

## 2017-11-26 DIAGNOSIS — R5382 Chronic fatigue, unspecified: Secondary | ICD-10-CM

## 2017-11-26 MED ORDER — LEVOCETIRIZINE DIHYDROCHLORIDE 5 MG PO TABS: 5.0000 mg | ORAL_TABLET | Freq: Every evening | ORAL | 5 refills | Status: DC

## 2017-11-26 MED ORDER — AMOXICILLIN-POT CLAVULANATE 875-125 MG PO TABS: 1.0000 | ORAL_TABLET | Freq: Two times a day (BID) | ORAL | 0 refills | Status: AC

## 2017-11-26 MED ORDER — LEVOCETIRIZINE DIHYDROCHLORIDE 2.5 MG/5ML PO SOLN: 2.5000 mg | Freq: Every evening | ORAL | 12 refills | Status: DC

## 2017-11-26 MED ORDER — FLUTICASONE PROPIONATE 50 MCG/ACT NA SUSP: 2.0000 | Freq: Every day | NASAL | 2 refills | Status: DC

## 2017-11-26 NOTE — Patient Instructions (Addendum)
Carol Conley,   Thank you for coming in to clinic today.  1. [facility] at [facility] has free counseling services for burnout.   - Self help options [facility]. [facility] may have some great resources.  2. Fatigue: labs today - Continue sleep medicine followup.  I recommend consultation with their specialist.  3. Sinus headaches: - Possible and likely sinusitis. - START Augmentin 875-125 mg one tablet every 12 hours for 10 days. - START mucinex to loosen secretions - Continue water - Continue Flonase 2 weeks daily - Continue antihistamine.  Try Xyzal or levocetirizine 5mg  once daily  Please schedule a follow-up appointment with Wilhelmina Mcardle, AGNP. Return if symptoms worsen or fail to improve.  If no improvement, come back in about 4 weeks.  If you have any other questions or concerns, please feel free to call the clinic or send a message through MyChart. You may also schedule an earlier appointment if necessary.  You will receive a survey after today's visit either digitally by e-mail or paper by [employer]. Your experiences and feedback matter to [place].  Please respond so we know how we are doing as we provide care for you.   Wilhelmina Mcardle, DNP, AGNP-BC Adult Gerontology Nurse Practitioner [facility]

## 2017-11-26 NOTE — Telephone Encounter (Signed)
Yes,  Should change to tablets.

## 2017-11-26 NOTE — Telephone Encounter (Signed)
Prescription sent over to pharmacy.

## 2017-11-26 NOTE — Telephone Encounter (Signed)
The pharmacist called with the questions about the Xyzal prescription. It was prescribed in liquid form and they wanted to make sure that this was not done in error.  Please advise

## 2017-11-26 NOTE — Progress Notes (Signed)
Subjective:    Patient ID: Carol Conley, female    DOB: 02-05-1991, 27 y.o.   MRN: [id number]  Carol Conley is a 27 y.o. female presenting on 11/26/2017 for Fatigue (constant bodyaches, joint pain x 3 weeks ) and Sinus Problem (sinus headaches )   HPI Fatigue Patient presents today for persistent fatigue.  July thought to have been related to shift work sleep disruption, but sleep study has been inconclusive to date.  She continues feeling the symptoms despite working no night shifts in "a while" (at least 3-4 weeks).  Fatigue is actually continuing to worsen and is associated with new body aches and joint pains, more emotional than normal lately.  Patient states these are her "Pre-sickness symptoms" but they are lasting for long term.   - Achiness worst in early morning, but no significant improvement after 30-60 minutes.  Excedrine helping with body aches - Exercise - none outside of work.  Occasional squats - Walk 1/2 mile to pool - Has started iron for ferritin deficiency without anemia. - Is drinking more water. 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  - Cries easily in any environment with simple stressors.    Sinus Headaches Patient has also had sinus headaches for the last 2 to 3 weeks.  These are relieved by Excedrin, which patient takes daily.  Chronically, patient is taking Flonase and Allegra.  Claritin and Zyrtec not sufficient in the past. - Pt denies fever, chills, sweats, nausea, vomiting.   Depression screen [facility] 2/9 11/26/2017 10/24/2017 10/06/2017 04/10/2017 08/31/2015  Decreased Interest 0 0 0 0 0  Down, Depressed, Hopeless 0 0 0 0 0  PHQ - 2 Score 0 0 0 0 0  Altered sleeping 3 0 3 - -  Tired, decreased energy _0 - -  Change in appetite 0 0 0 - -  Feeling bad or failure about yourself  0 0 0 - -  Trouble concentrating 1 0 0 - -  Moving slowly or fidgety/restless 0 0 0 - -  Suicidal thoughts 0 0 0 - -    PHQ-9 Score _1 - -  Difficult doing work/chores Very difficult Not difficult at all Somewhat difficult - -    GAD 7 : Generalized Anxiety Score 11/26/2017  Nervous, Anxious, on Edge 2  Control/stop worrying 0  Worry too much - different things 0  Trouble relaxing 0  Restless 1  Easily annoyed or irritable 2  Afraid - awful might happen 0  Total GAD 7 Score 5  Anxiety Difficulty Not difficult at all   Social History   Tobacco Use  . Smoking status: Never Smoker  . Smokeless tobacco: Never Used  Substance Use Topics  . Alcohol use: Yes    Comment: occasionally   . [name]    Review of Systems Per HPI unless specifically indicated above     Objective:    BP (!) 98/55 (BP Location: Left Arm, Patient Position: Sitting, Cuff Size: Normal)   Pulse 90   Temp 98.6 F (37 C) (Oral)   Ht 5' 4" (1.626 m)   Wt 151 lb (68.5 kg)   BMI 25.92 kg/m   Wt Readings from Last 3 Encounters:  11/26/17 151 lb (68.5 kg)  10/24/17 150 lb 9.6 oz (68.3 kg)  10/06/17 153 lb 3.2 oz (69.5 kg)    Physical Exam  Constitutional: She is oriented to  person, place, and time. She appears well-developed and well-nourished. No distress.  Visibly fatigued today  HENT:  Head: Normocephalic and atraumatic.  Right Ear: Hearing, tympanic membrane, external ear and ear canal normal.  Left Ear: Hearing, tympanic membrane, external ear and ear canal normal.  Nose: Right sinus exhibits maxillary sinus tenderness. Right sinus exhibits no frontal sinus tenderness. Left sinus exhibits maxillary sinus tenderness. Left sinus exhibits no frontal sinus tenderness.  Mouth/Throat: Oropharynx is clear and moist.  Eyes: Pupils are equal, round, and reactive to light. Conjunctivae are normal.  Neck: Normal range of motion. Neck supple. No JVD present. No tracheal deviation present. No thyromegaly present.  Cardiovascular: Normal rate, regular rhythm, normal heart sounds and intact distal pulses. Exam reveals no  gallop and no friction rub.  No murmur heard. Pulmonary/Chest: Effort normal and breath sounds normal. No respiratory distress.  Musculoskeletal: Normal range of motion.  Lymphadenopathy:    She has no cervical adenopathy.  Neurological: She is alert and oriented to person, place, and time. No cranial nerve deficit.  Skin: Skin is warm and dry. Capillary refill takes less than 2 seconds.  Psychiatric: Her behavior is normal. Judgment and thought content normal. Her affect is not inappropriate. Cognition and memory are normal. She exhibits a depressed mood. She is communicative. She is attentive.  Nursing note and vitals reviewed.    Results for orders placed or performed in visit on 10/24/17  CBC with Differential/Platelet  Result Value Ref Range   WBC 7.4 3.8 - 10.8 Thousand/uL   RBC 4.72 3.80 - 5.10 Million/uL   Hemoglobin 12.3 11.7 - 15.5 g/dL   HCT 37.1 35.0 - 45.0 %   MCV 78.6 (L) 80.0 - 100.0 fL   MCH 26.1 (L) 27.0 - 33.0 pg   MCHC 33.2 32.0 - 36.0 g/dL   RDW 12.6 11.0 - 15.0 %   Platelets 301 140 - 400 Thousand/uL   MPV 9.3 7.5 - 12.5 fL   Neutro Abs 4,433 1,500 - 7,800 cells/uL   Lymphs Abs 2,146 850 - 3,900 cells/uL   WBC mixed population 592 200 - 950 cells/uL   Eosinophils Absolute 170 15 - 500 cells/uL   Basophils Absolute 59 0 - 200 cells/uL   Neutrophils Relative % 59.9 %   Total Lymphocyte 29.0 %   Monocytes Relative 8.0 %   Eosinophils Relative 2.3 %   Basophils Relative 0.8 %  Ferritin  Result Value Ref Range   Ferritin 5 (L) 16 - 154 ng/mL  TSH  Result Value Ref Range   TSH 1.63 mIU/L  Lipid panel  Result Value Ref Range   Cholesterol 189 <200 mg/dL   HDL 62 >50 mg/dL   Triglycerides 95 <150 mg/dL   LDL Cholesterol (Calc) 108 (H) mg/dL (calc)   Total CHOL/HDL Ratio 3.0 <5.0 (calc)   Non-HDL Cholesterol (Calc) 127 <130 mg/dL (calc)  COMPLETE METABOLIC PANEL WITH GFR  Result Value Ref Range   Glucose, Bld 86 65 - 99 mg/dL   BUN 14 7 - 25 mg/dL    Creat 0.91 0.50 - 1.10 mg/dL   GFR, Est Non African American 85 > OR = 60 mL/min/1.73m2   GFR, Est African American 99 > OR = 60 mL/min/1.73m2   BUN/Creatinine Ratio NOT APPLICABLE 6 - 22 (calc)   Sodium 139 135 - 146 mmol/L   Potassium 4.0 3.5 - 5.3 mmol/L   Chloride 106 98 - 110 mmol/L   CO2 25 20 - 32 mmol/L   Calcium   9.2 8.6 - 10.2 mg/dL   Total Protein 6.9 6.1 - 8.1 g/dL   Albumin 4.0 3.6 - 5.1 g/dL   Globulin 2.9 1.9 - 3.7 g/dL (calc)   AG Ratio 1.4 1.0 - 2.5 (calc)   Total Bilirubin 0.4 0.2 - 1.2 mg/dL   Alkaline phosphatase (APISO) 76 33 - 115 U/L   AST 16 10 - 30 U/L   ALT 10 6 - 29 U/L      Assessment & Plan:   Problem List Items Addressed This Visit    None    Visit Diagnoses    Chronic fatigue    -  Primary Ongoing, slightly worsening chronic fatigue.  Multifactorial and less likely related to shift work sleep disorder at this time.  Possible causes are sleep disorder, inflammatory disease, burnout, depression, infection.  Plan: 1. Continue with sleep specialist evaluation at [facility] 2. Labs today  3. Encourage patient to consider counseling, self help.  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 4. Followup prn and in 4 weeks if no improvement.   Relevant Orders   Sed Rate (ESR)   C-reactive protein   Antinuclear Antib (ANA)   Rheumatoid Factor   Seasonal allergic rhinitis due to pollen     Ongoing and causing increased sinus pressure, greatest L maxillary space.  See AP sinusitis below.   Relevant Medications   fluticasone (FLONASE) 50 MCG/ACT nasal spray   levocetirizine (XYZAL) 2.5 MG/5ML solution   Subacute maxillary sinusitis     Consistent with seasonal allergic and secondary sinusitis with symptoms worsening over the past 7 days and initial symptoms of nasal congestion and sinus pressure over 2 weeks ago.   Plan: 1.START taking Augmentin 875-125 mg tablets every 12 hours for 10 days.  Discussed completing  antibiotic. - While on antibiotic, take a probiotic OTC or from food. - Continue anti-histamine Xyzal or Allegra daily. - Continue to use Flonase 2 sprays each nostril daily for up to 2-4 weeks if no epistaxis. - Start Mucinex OTC for  7-10 days prn congestion 2. Supportive care with nasal saline, warm herbal tea with honey, 3. Improve hydration 4. Tylenol / Motrin PRN fevers  5. Return criteria given    Relevant Medications   fluticasone (FLONASE) 50 MCG/ACT nasal spray   levocetirizine (XYZAL) 2.5 MG/5ML solution   amoxicillin-clavulanate (AUGMENTIN) 875-125 MG tablet      Meds ordered this encounter  Medications  . fluticasone (FLONASE) 50 MCG/ACT nasal spray    Sig: Place 2 sprays into both nostrils daily.    Dispense:  16 g    Refill:  2    Order Specific Question:   Supervising Provider    Answer:   KARAMALEGOS, ALEXANDER J [[id number]]  . levocetirizine (XYZAL) 2.5 MG/5ML solution    Sig: Take 5 mLs (2.5 mg total) by mouth every evening.    Dispense:  148 mL    Refill:  12    Order Specific Question:   Supervising Provider    Answer:   KARAMALEGOS, ALEXANDER J [[id number]]  . amoxicillin-clavulanate (AUGMENTIN) 875-125 MG tablet    Sig: Take 1 tablet by mouth 2 (two) times daily for 10 days.    Dispense:  20 tablet    Refill:  0    Order Specific Question:   Supervising Provider    Answer:   KARAMALEGOS, ALEXANDER J [[id number]]    Follow up plan: Return if symptoms worsen or fail to improve.   ,   DNP, AGPCNP-BC Adult Gerontology Primary Care Nurse Practitioner [facility] 11/26/2017, 9:32 AM

## 2017-11-26 NOTE — Telephone Encounter (Signed)
Sisilia at [facility] needs clarification on xyzal [phone]

## 2017-12-01 LAB — ANA: Anti Nuclear Antibody(ANA): NEGATIVE

## 2017-12-01 LAB — RHEUMATOID FACTOR: Rhuematoid fact SerPl-aCnc: <14 IU/mL (ref <14)

## 2017-12-01 LAB — SEDIMENTATION RATE: Sed Rate: 6 mm/h (ref 0–20)

## 2017-12-01 LAB — C-REACTIVE PROTEIN: CRP: 2 mg/L (ref <8.0)

## 2018-01-27 IMAGING — NM NM GASTRIC EMPTYING
1 series · 10 of 10 positions shown · non-contrast
Comparison: None.

CLINICAL DATA: Abdominal/thumb pain for years. Pain is most often
after meals. Nausea and vomiting for 10 years.

EXAM:
NUCLEAR MEDICINE GASTRIC EMPTYING SCAN
TECHNIQUE: After oral ingestion of radiolabeled meal, sequential abdominal
images were obtained for 4 hours. Percentage of activity emptying
the stomach was calculated at 1 hour, 2 hour, 3 hour, and 4 hours.
RADIOPHARMACEUTICALS:  2.41 mCi 5c-SSm sulfur colloid in
standardized meal

[Series 1000: gatric statics (results) · 3.90mm/px · 5 acquisitions, 10 frames shown]
[im 1/5]
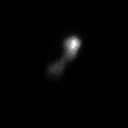
[im 1/5]
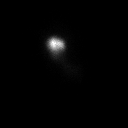
[im 2/5]
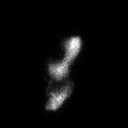
[im 2/5]
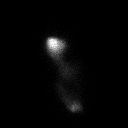
[im 3/5]
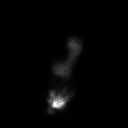
[im 3/5]
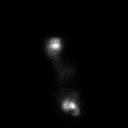
[im 4/5]
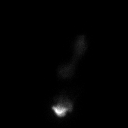
[im 4/5]
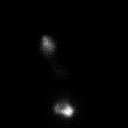
[im 5/5]
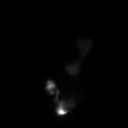
[im 5/5]
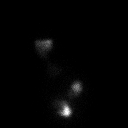

[10 of 10 positions shown; findings below may reference images not displayed]

FINDINGS: Expected location of the stomach in the left upper quadrant.
Ingested meal empties the stomach gradually over the course of the
study.

35% emptied at 1 hr ( normal >= 10%)

63% emptied at 2 hr ( normal >= 40%)

75% emptied at 3 hr ( normal >= 70%)

81% emptied at 4 hr ( normal >= 90%)
IMPRESSION: Gastric emptying is mildly delayed at 4 hours. It was within normal
limits at 1, 2, and 3 hours.

## 2018-03-05 ENCOUNTER — Encounter: Payer: 59 | Admitting: Obstetrics and Gynecology

## 2018-03-25 ENCOUNTER — Other Ambulatory Visit: Payer: Self-pay | Admitting: Gastroenterology

## 2018-03-25 DIAGNOSIS — R110 Nausea: Secondary | ICD-10-CM

## 2018-04-24 ENCOUNTER — Telehealth: Payer: Self-pay | Admitting: Gastroenterology

## 2018-04-24 NOTE — Telephone Encounter (Signed)
Pt is calling she states her rx Dixalint  60 mg needs prior authorization she needs it send to [facility]

## 2018-04-27 ENCOUNTER — Other Ambulatory Visit: Payer: Self-pay

## 2018-04-27 MED ORDER — DEXLANSOPRAZOLE 60 MG PO CPDR: DELAYED_RELEASE_CAPSULE | ORAL | 6 refills | Status: DC

## 2018-04-27 NOTE — Telephone Encounter (Signed)
Rx for [name] 60mg  has been sent to [facility] outpatient pharmacy per pt request.

## 2018-05-18 ENCOUNTER — Telehealth: Payer: Self-pay | Admitting: Gastroenterology

## 2018-05-18 NOTE — Telephone Encounter (Signed)
Pt is calling to speak with Ginger regarding prior authorization on rx Dexalint

## 2018-05-21 ENCOUNTER — Other Ambulatory Visit: Payer: Self-pay

## 2018-05-21 MED ORDER — PANTOPRAZOLE SODIUM 40 MG PO TBEC: 40.0000 mg | DELAYED_RELEASE_TABLET | Freq: Every day | ORAL | 0 refills | Status: DC

## 2018-05-21 NOTE — Telephone Encounter (Signed)
Pt's insurance is requiring her to try and fail Pantoprazole, Omeprazole or Lansoprazole before they will cover the [name].   Please okay for me to send Pantoprazole to pt's pharmacy. She is requesting to take this twice daily as she feels once daily will not help her severe reflux.

## 2018-05-22 ENCOUNTER — Other Ambulatory Visit: Payer: Self-pay | Admitting: Obstetrics and Gynecology

## 2018-05-22 NOTE — Telephone Encounter (Signed)
Yes that is ok

## 2018-06-15 ENCOUNTER — Other Ambulatory Visit: Payer: Self-pay | Admitting: Gastroenterology

## 2018-06-18 ENCOUNTER — Telehealth: Payer: Self-pay | Admitting: Gastroenterology

## 2018-06-18 NOTE — Telephone Encounter (Signed)
Pt is calling she wants to speak with Ginger regarding Rx Protonix is there something she can take with it or something else because it is mot working please call pt.

## 2018-06-18 NOTE — Telephone Encounter (Signed)
LVM for pt to return my call.

## 2018-06-19 NOTE — Telephone Encounter (Signed)
Spoke with pt regarding her symptoms. She is currently taking Pantoprazole 40mg  twice daily due to increased reflux symptoms. She is also experiencing abdominal pain with nausea and has vomited a couple of times in the last month. She stated her symptoms are so severe she is actually on disciplinary action with her job due to calling out so much. 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 She was taking [name] but its not covered under her insurance plan. Please advise if there is something else she can add or do for symptoms.

## 2018-06-22 ENCOUNTER — Other Ambulatory Visit: Payer: Self-pay

## 2018-06-22 MED ORDER — PANTOPRAZOLE SODIUM 40 MG PO TBEC: 40.0000 mg | DELAYED_RELEASE_TABLET | Freq: Two times a day (BID) | ORAL | 5 refills | Status: DC

## 2018-06-22 NOTE — Telephone Encounter (Signed)
Pt would like to get a rx for the Donnatal. When I went to order it, it was not covered under her insurance. Do you recommend another medication?

## 2018-06-22 NOTE — Telephone Encounter (Signed)
If it all about heartburn then she can add pepcid at night. If it is about her irritable bowel syndrome we may want to add donnital.

## 2018-06-30 ENCOUNTER — Other Ambulatory Visit: Payer: Self-pay

## 2018-06-30 MED ORDER — DESIPRAMINE HCL 25 MG PO TABS: 25.0000 mg | ORAL_TABLET | Freq: Every day | ORAL | 3 refills | Status: DC

## 2018-06-30 NOTE — Telephone Encounter (Signed)
Rx for desipramine 25mg  sent to [facility] outpatient pharmacy. Left vm letting pt know this was sent.

## 2018-06-30 NOTE — Telephone Encounter (Signed)
See if she has tried desipramine before if not let us give her 25 mg to be taken before bedtime.

## 2018-07-01 ENCOUNTER — Other Ambulatory Visit: Payer: Self-pay

## 2018-07-01 ENCOUNTER — Encounter: Payer: Self-pay | Admitting: Nurse Practitioner

## 2018-07-01 ENCOUNTER — Telehealth: Payer: Self-pay | Admitting: Gastroenterology

## 2018-07-01 MED ORDER — PB-HYOSCY-ATROPINE-SCOPOLAMINE 16.2 MG PO TABS: 1.0000 | ORAL_TABLET | ORAL | 1 refills | Status: DC | PRN

## 2018-07-01 NOTE — Telephone Encounter (Signed)
Pt is calling for Ginger stating she was supposed to get a new rx Despramine and she wanted to let Ginger know that she does not tolerate this medicine to please call in something else.

## 2018-07-01 NOTE — Telephone Encounter (Signed)
FYI.Marland KitchenMarland KitchenContacted pt and she stated the Desipramine caused constipation and insomnia. Would like to go back to your original request of Donnatal. She is aware its not covered. She has requested I do a prior authorization. Rx has been sent to [facility] outpatient pharmacy.

## 2018-07-02 ENCOUNTER — Telehealth: Payer: Self-pay | Admitting: Gastroenterology

## 2018-07-02 ENCOUNTER — Telehealth: Payer: Self-pay

## 2018-07-02 NOTE — Telephone Encounter (Signed)
Aware of medication not being covered. I have already started a prior authorization on this medication yesterday.

## 2018-07-02 NOTE — Telephone Encounter (Signed)
[facility] pharmacy sent a fax stating the medication Donnatal 16.2 mg is not covered by patient's insurance. I have placed fax in your box.

## 2018-07-02 NOTE — Telephone Encounter (Signed)
[facility] faxed a prior authorization for Phenohytro tablet   Written:07-01-18 orig quant:30 auth's refill 1     Last refill 07-01-18(h)   Quant left:60 days supply 30       Take 1 tab by mouth as needed. I placed the form on your desk.

## 2018-07-02 NOTE — Telephone Encounter (Signed)
Duplicate messaged.

## 2018-07-03 ENCOUNTER — Telehealth: Payer: Self-pay | Admitting: Gastroenterology

## 2018-07-03 NOTE — Telephone Encounter (Signed)
[employer] faxed a prior approval(Adverse Benefit Determination) for Donnatal 16.2 mg. You will need to contact the to request a prior authorization. Prior auth ref number :[id number] Plan [facility]  Plan code [phone]. I have place on your desk.

## 2018-07-03 NOTE — Telephone Encounter (Signed)
If you would like to discuss this case with a clinical reviewer, please call [phone].

## 2018-07-09 ENCOUNTER — Other Ambulatory Visit: Payer: Self-pay | Admitting: Obstetrics and Gynecology

## 2018-08-24 ENCOUNTER — Other Ambulatory Visit: Payer: Self-pay | Admitting: Obstetrics and Gynecology

## 2018-08-25 ENCOUNTER — Other Ambulatory Visit: Payer: Self-pay | Admitting: *Deleted

## 2018-08-25 ENCOUNTER — Telehealth: Payer: Self-pay | Admitting: Obstetrics and Gynecology

## 2018-08-25 MED ORDER — JUNEL FE 1/20 1-20 MG-MCG PO TABS: ORAL_TABLET | ORAL | 3 refills | Status: DC

## 2018-08-25 NOTE — Telephone Encounter (Signed)
The patient called and stated that she needs a birth control refill. The patient has scheduled her annual exam with Melody. Please advise.

## 2018-08-25 NOTE — Telephone Encounter (Signed)
Done-ac 

## 2018-09-23 ENCOUNTER — Encounter: Payer: Self-pay | Admitting: Nurse Practitioner

## 2018-09-23 ENCOUNTER — Telehealth: Payer: Self-pay

## 2018-09-23 NOTE — Telephone Encounter (Signed)
Coronavirus (COVID-19) Are you at risk?  Are you at risk for the Coronavirus (COVID-19)?  To be considered HIGH RISK for Coronavirus (COVID-19), you have to meet the following criteria:  . Traveled to [place], [place], [place], [place] or [place]; or in the [place] to [place], [place], [place], or [place]; and have fever, cough, and shortness of breath within the last 2 weeks of travel OR . Been in close contact with a person diagnosed with COVID-19 within the last 2 weeks and have fever, cough, and shortness of breath . IF YOU DO NOT MEET THESE CRITERIA, YOU ARE CONSIDERED LOW RISK FOR COVID-19.  What to do if you are HIGH RISK for COVID-19?  . If you are having a medical emergency, call 911. . Seek medical care right away. Before you go to a doctor's office, urgent care or emergency department, call ahead and tell them about your recent travel, contact with someone diagnosed with COVID-19, and your symptoms. You should receive instructions from your physician's office regarding next steps of care.  . When you arrive at healthcare provider, tell the healthcare staff immediately you have returned from visiting [place], [place], [place], [place] or [place]; or traveled in the [place] to [place], [place], [place], or [place]; in the last two weeks or you have been in close contact with a person diagnosed with COVID-19 in the last 2 weeks.   . Tell the health care staff about your symptoms: fever, cough and shortness of breath. . After you have been seen by a medical provider, you will be either: o Tested for (COVID-19) and discharged home on quarantine except to seek medical care if symptoms worsen, and asked to  - Stay home and avoid contact with others until you get your results (4-5 days)  - Avoid travel on public transportation if possible (such as bus, train, or airplane) or o Sent to the Emergency Department by EMS for evaluation, COVID-19 testing, and possible  admission depending on your condition and test results.  What to do if you are LOW RISK for COVID-19?  Reduce your risk of any infection by using the same precautions used for avoiding the common cold or flu:  . Wash your hands often with soap and warm water for at least 20 seconds.  If soap and water are not readily available, use an alcohol-based hand sanitizer with at least 60% alcohol.  . If coughing or sneezing, cover your mouth and nose by coughing or sneezing into the elbow areas of your shirt or coat, into a tissue or into your sleeve (not your hands). . Avoid shaking hands with others and consider head nods or verbal greetings only. . Avoid touching your eyes, nose, or mouth with unwashed hands.  . Avoid close contact with people who are Meliya Mcconahy. . Avoid places or events with large numbers of people in one location, like concerts or sporting events. . Carefully consider travel plans you have or are making. . If you are planning any travel outside or inside the [place], visit the CDC's Travelers' Health webpage for the latest health notices. . If you have some symptoms but not all symptoms, continue to monitor at home and seek medical attention if your symptoms worsen. . If you are having a medical emergency, call 911.  09/23/18 SCREENING NEG SLS ADDITIONAL HEALTHCARE OPTIONS FOR PATIENTS  [facility] / e-Visit: [email or web address]         [name]: [phone]    [facility]: [phone]                   [facility]: [phone]  

## 2018-09-24 ENCOUNTER — Encounter: Payer: Self-pay | Admitting: Obstetrics and Gynecology

## 2018-09-24 ENCOUNTER — Ambulatory Visit (INDEPENDENT_AMBULATORY_CARE_PROVIDER_SITE_OTHER): Payer: No Typology Code available for payment source | Admitting: Obstetrics and Gynecology

## 2018-09-24 ENCOUNTER — Other Ambulatory Visit: Payer: Self-pay

## 2018-09-24 ENCOUNTER — Other Ambulatory Visit (HOSPITAL_COMMUNITY)
Admission: RE | Admit: 2018-09-24 | Discharge: 2018-09-24 | Disposition: A | Discharge status: Home / Self Care | Payer: No Typology Code available for payment source | Source: Ambulatory Visit | Attending: Obstetrics and Gynecology | Admitting: Obstetrics and Gynecology

## 2018-09-24 VITALS — BP 101/73 | HR 88 | Ht 65.0 in | Wt 153.2 lb

## 2018-09-24 DIAGNOSIS — Z01419 Encounter for gynecological examination (general) (routine) without abnormal findings: Secondary | ICD-10-CM

## 2018-09-24 DIAGNOSIS — R300 Dysuria: Secondary | ICD-10-CM

## 2018-09-24 DIAGNOSIS — R350 Frequency of micturition: Secondary | ICD-10-CM | POA: Diagnosis not present

## 2018-09-24 LAB — POCT URINALYSIS DIPSTICK
Bilirubin, UA: NEGATIVE
Blood, UA: NEGATIVE
Glucose, UA: NEGATIVE
Ketones, UA: NEGATIVE
Leukocytes, UA: NEGATIVE
Nitrite, UA: NEGATIVE
Protein, UA: NEGATIVE
Spec Grav, UA: 1.015 (ref 1.010–1.025)
Urobilinogen, UA: 0.2 E.U./dL
pH, UA: 7.5 (ref 5.0–8.0)

## 2018-09-24 MED ORDER — JUNEL FE 1/20 1-20 MG-MCG PO TABS: ORAL_TABLET | ORAL | 4 refills | Status: DC

## 2018-09-24 NOTE — Progress Notes (Signed)
Subjective:   Carol Conley is a 28 y.o. G0P0000 Caucasian female here for a routine well-woman exam.  No LMP recorded. (Menstrual status: Oral contraceptives).    Current complaints: recurrent IBS with boughts of constipation, urine frequency, breakthrough acid reflux PCP: Tora Duck, FNP       Patient desire urine POC  Talked at length about recurrent boughts of IBS and constipation. Patient states that she takes Bentyl or miralax PRN for the presenting s/s. Discussed breakthrough acid reflux. She has changed her diet to reduce acid intake and is taking Protonix, but still having daily breakthrough acid reflux. Advised to present to PCP for reevaluation of IBS and acid reflux. Discussed also seeking out information on a functional medicine physician to address the chronic GI Inflammation from a different perspective.Pt reports urinary frequency,  denies urgency or dysuria. 1+ ketones noted in UA today. Discussed increased water intake and to RTC if s/s of dysuria or urgency onset.  Patient is not sexually active and never had pap. Verbally went through the exam, showed speculum and cervical pap brush. Patient desires pap for cervical cytology. Patient very nervous with exam. Speculum exam performed with SMALLEST Peterson speculum 2/2 pt reporting pain with small speculum tip insertion into introitus.   Social History: Sexual: heterosexual [name] Living situation: alone Occupation: Museum/gallery exhibitions officer at [employer] Tobacco/alcohol: no tobacco or alcohol use Illicit drugs: no history of illicit drug use  The following portions of the patient's history were reviewed and updated as appropriate: allergies, current medications, past family history, past medical history, past social history, past surgical history and problem list.  Past Medical History Past Medical History:  Diagnosis Date  . Allergy   . Asthma    as child  . Colitis   . GERD (gastroesophageal reflux disease)   .  Headache    sinus  . IBS (irritable bowel syndrome)   . 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 Motion sickness    cars, planes  . Sinusitis     Past Surgical History Past Surgical History:  Procedure Laterality Date  . ESOPHAGEAL DILATION  04/08/2016   Procedure: ESOPHAGEAL DILATION;  Surgeon: Lucilla Lame, MD;  Location: [place];  Service: Endoscopy;;  . ESOPHAGEAL DILATION  12/26/2016   Procedure: ESOPHAGEAL DILATION;  Surgeon: Lucilla Lame, MD;  Location: [place];  Service: Endoscopy;;  . ESOPHAGOGASTRODUODENOSCOPY (EGD) WITH PROPOFOL N/A 04/08/2016   Procedure: ESOPHAGOGASTRODUODENOSCOPY (EGD) WITH PROPOFOL;  Surgeon: Lucilla Lame, MD;  Location: [place];  Service: Endoscopy;  Laterality: N/A;  . ESOPHAGOGASTRODUODENOSCOPY (EGD) WITH PROPOFOL N/A 12/26/2016   Procedure: ESOPHAGOGASTRODUODENOSCOPY (EGD) WITH PROPOFOL;  Surgeon: Lucilla Lame, MD;  Location: [place];  Service: Endoscopy;  Laterality: N/A;  UPREG  . OVARIAN CYST REMOVAL Right    2007  . [facility]      Gynecologic History G0P0000  No LMP recorded. (Menstrual status: Oral contraceptives). Contraception: OCP (estrogen/progesterone); Patient not sexually active Last Pap: never. Results were: N/A   Obstetric History OB History  Gravida Para Term Preterm AB Living  0 0 0 0 0 0  SAB TAB Ectopic Multiple Live Births  0 0 0 0      Current Medications Current Outpatient Medications on File Prior to Visit  Medication Sig Dispense Refill  . [name];[name] Inhale 2 puffs into the lungs every 6 (six) hours as needed for wheezing or shortness of breath. 1 Inhaler 0  .  aspirin-acetaminophen-caffeine (EXCEDRIN MIGRAINE) 250-250-65 MG per tablet Take 2 tablets by mouth every 8 (eight) hours as needed for migraine.     . [name] TAKE 1 CAPSULE BY MOUTH 4 TIMES DAILY - BEFORE MEALS AND AT BEDTIME. 90  capsule 3  . [name] Take 180 mg by mouth daily. As needed    . fluticasone (FLONASE) 50 MCG/ACT nasal spray Place 2 sprays into both nostrils daily. 16 g 2  . [name] Place 1 suppository (25 mg total) rectally 2 (two) times daily. 14 suppository 2  . [name] 1-20 MG-MCG tablet TAKE 1 TABLET BY MOUTH DAILY CONTINUOUSLY AS DIRECTED 28 tablet 3  . Lidocaine, [name], [name] Relief of anorectal pain and itching: Apply small, pea-sized amount of cream to affected area up to 6 times daily as needed 30 g 1  . Melatonin 1 MG TABS Take 1-2 tablets (1-2 mg total) by mouth at bedtime.    . [name] TAKE 1 TABLET BY MOUTH 4 TIMES DAILY. 60 tablet 3  . [name] DISSOLVE 1 TABLET BY MOUTH EVERY 8 HOURS AS NEEDED FOR NAUSEA OR VOMITING 30 tablet 3  . pantoprazole (PROTONIX) 40 MG tablet Take 1 tablet (40 mg total) by mouth 2 (two) times daily. 60 tablet 5  . Pediatric Multivit-Minerals-C (KIDS GUMMY BEAR VITAMINS PO) Take by mouth.    . [name] Place 1 application rectally 2 (two) times daily as needed for hemorrhoids. 30 g 0  . [name] Take 1 tablet (25 mg total) by mouth every 6 (six) hours as needed for nausea or vomiting. 30 tablet 6  . dexlansoprazole ([name]) 60 MG capsule TAKE 1 CAPSULE (60 MG TOTAL) BY MOUTH DAILY. (Patient not taking: Reported on 09/24/2018) 60 capsule 6  . [name] Take by mouth.    . psyllium (METAMUCIL MULTIHEALTH FIBER) 58.6 % powder Take 1/4 dose daily for bowel regularity.  After 2 weeks, increase to 1/2 dose if needed. (Patient not taking: Reported on 09/24/2018) 283 g 12  . SUMAtriptan (IMITREX) 50 MG tablet Take 1 tablet at onset of headache May repeat every 2 hours x 3 total doses in 24  hours if headache persists or returns. (Patient not taking: Reported on 11/26/2017) 10 tablet 0   No current facility-administered medications on file prior to visit.     Review of Systems Patient denies any headaches, blurred vision, shortness of breath, chest pain, or intercourse.  Objective:  BP 101/73   Pulse 88   Ht 5\' 5"  (1.651 m)   Wt 153 lb 3 oz (69.5 kg)   BMI 25.49 kg/m  Physical Exam  General:  Well developed, well nourished, no acute distress. She is alert and oriented x3. Slightly anxious. Skin:  Warm and dry Neck:  Midline trachea, no thyromegaly or nodules Cardiovascular: Regular rate and rhythm, no murmur heard Lungs:  Effort normal, all lung fields clear to auscultation bilaterally Breasts:  No dominant palpable mass, retraction, or nipple discharge Abdomen:  Soft, non tender, no hepatosplenomegaly or masses Pelvic:  External genitalia is normal in appearance.  The vagina is normal in appearance. The cervix is bulbous, no CMT.  Thin prep pap is done with HR HPV cotesting. Bimanuel exam deferred 2/2 patient difficulty tolerating speculum exam.  Extremities:  No swelling or  varicosities noted Psych:  She has a normal mood and affect Urinalysis    Component Value Date/Time   COLORURINE YELLOW (A) 05/04/2015 1642   APPEARANCEUR CLEAR (A) 05/04/2015 1642   APPEARANCEUR Cloudy 03/08/2014 1730   LABSPEC 1.026 05/04/2015 1642   LABSPEC 1.024 03/08/2014 1730   PHURINE 5.0 05/04/2015 1642   GLUCOSEU NEGATIVE 05/04/2015 1642   GLUCOSEU Negative 03/08/2014 1730   HGBUR NEGATIVE 05/04/2015 1642   BILIRUBINUR neg 09/24/2018 0929   BILIRUBINUR Negative 03/08/2014 1730   KETONESUR 1+ (A) 05/04/2015 1642   PROTEINUR Negative 09/24/2018 0929   PROTEINUR NEGATIVE 05/04/2015 1642   UROBILINOGEN 0.2 09/24/2018 0929   UROBILINOGEN 0.2 10/28/2014 1728   NITRITE neg 09/24/2018 0929   NITRITE NEGATIVE 05/04/2015 1642   LEUKOCYTESUR Negative 09/24/2018 0929   LEUKOCYTESUR  Negative 03/08/2014 1730    Assessment:   Healthy well-woman exam IBS Acid Reflux  Plan:  Junel FE refilled Reassured of normal urinalysis. F/U 1 yr for AE, or sooner if needed  Maisie FusBethann Jumper, SNM Melody Suzan NailerN Shambley, CNM

## 2018-09-24 NOTE — Patient Instructions (Signed)
 Preventive Care 21-30 Years Old, Female Preventive care refers to visits with your health care provider and lifestyle choices that can promote health and wellness. This includes:  A yearly physical exam. This may also be called an annual well check.  Regular dental visits and eye exams.  Immunizations.  Screening for certain conditions.  Healthy lifestyle choices, such as eating a healthy diet, getting regular exercise, not using drugs or products that contain nicotine and tobacco, and limiting alcohol use. What can I expect for my preventive care visit? Physical exam Your health care provider will check your:  Height and weight. This may be used to calculate body mass index (BMI), which tells if you are at a healthy weight.  Heart rate and blood pressure.  Skin for abnormal spots. Counseling Your health care provider may ask you questions about your:  Alcohol, tobacco, and drug use.  Emotional well-being.  Home and relationship well-being.  Sexual activity.  Eating habits.  Work and work environment.  Method of birth control.  Menstrual cycle.  Pregnancy history. What immunizations do I need?  Influenza (flu) vaccine  This is recommended every year. Tetanus, diphtheria, and pertussis (Tdap) vaccine  You may need a Td booster every 10 years. Varicella (chickenpox) vaccine  You may need this if you have not been vaccinated. Human papillomavirus (HPV) vaccine  If recommended by your health care provider, you may need three doses over 6 months. Measles, mumps, and rubella (MMR) vaccine  You may need at least one dose of MMR. You may also need a second dose. Meningococcal conjugate (MenACWY) vaccine  One dose is recommended if you are age 19-21 years and a first-year college student living in a residence hall, or if you have one of several medical conditions. You may also need additional booster doses. Pneumococcal conjugate (PCV13) vaccine  You may need  this if you have certain conditions and were not previously vaccinated. Pneumococcal polysaccharide (PPSV23) vaccine  You may need one or two doses if you smoke cigarettes or if you have certain conditions. Hepatitis A vaccine  You may need this if you have certain conditions or if you travel or work in places where you may be exposed to hepatitis A. Hepatitis B vaccine  You may need this if you have certain conditions or if you travel or work in places where you may be exposed to hepatitis B. Haemophilus influenzae type b (Hib) vaccine  You may need this if you have certain conditions. You may receive vaccines as individual doses or as more than one vaccine together in one shot (combination vaccines). Talk with your health care provider about the risks and benefits of combination vaccines. What tests do I need?  Blood tests  Lipid and cholesterol levels. These may be checked every 5 years starting at age 20.  Hepatitis C test.  Hepatitis B test. Screening  Diabetes screening. This is done by checking your blood sugar (glucose) after you have not eaten for a while (fasting).  Sexually transmitted disease (STD) testing.  BRCA-related cancer screening. This may be done if you have a family history of breast, ovarian, tubal, or peritoneal cancers.  Pelvic exam and Pap test. This may be done every 3 years starting at age 21. Starting at age 30, this may be done every 5 years if you have a Pap test in combination with an HPV test. Talk with your health care provider about your test results, treatment options, and if necessary, the need for more   tests. Follow these instructions at home: Eating and drinking   Eat a diet that includes fresh fruits and vegetables, whole grains, lean protein, and low-fat dairy.  Take vitamin and mineral supplements as recommended by your health care provider.  Do not drink alcohol if: ? Your health care provider tells you not to drink. ? You are  pregnant, may be pregnant, or are planning to become pregnant.  If you drink alcohol: ? Limit how much you have to 0-1 drink a day. ? Be aware of how much alcohol is in your drink. In the [place] one drink equals one 12 oz bottle of beer (355 mL), one 5 oz glass of wine (148 mL), or one 1 oz glass of hard liquor (44 mL). Lifestyle  Take daily care of your teeth and gums.  Stay active. Exercise for at least 30 minutes on 5 or more days each week.  Do not use any products that contain nicotine or tobacco, such as cigarettes, e-cigarettes, and chewing tobacco. If you need help quitting, ask your health care provider.  If you are sexually active, practice safe sex. Use a condom or other form of birth control (contraception) in order to prevent pregnancy and STIs (sexually transmitted infections). If you plan to become pregnant, see your health care provider for a preconception visit. What's next?  Visit your health care provider once a year for a well check visit.  Ask your health care provider how often you should have your eyes and teeth checked.  Stay up to date on all vaccines. This information is not intended to replace advice given to you by your health care provider. Make sure you discuss any questions you have with your health care provider. Document Released: 04/30/2001 Document Revised: 11/13/2017 Document Reviewed: 11/13/2017 Elsevier Patient Education  2020 [employer].

## 2018-09-26 LAB — URINE CULTURE: Organism ID, Bacteria: NO GROWTH

## 2018-09-29 ENCOUNTER — Other Ambulatory Visit: Payer: Self-pay

## 2018-09-29 ENCOUNTER — Ambulatory Visit
Admission: EM | Admit: 2018-09-29 | Discharge: 2018-09-29 | Disposition: A | Discharge status: Home / Self Care | Payer: No Typology Code available for payment source | Attending: Family | Admitting: Family

## 2018-09-29 ENCOUNTER — Telehealth: Payer: Self-pay | Admitting: Gastroenterology

## 2018-09-29 DIAGNOSIS — G4489 Other headache syndrome: Secondary | ICD-10-CM

## 2018-09-29 DIAGNOSIS — R51 Headache: Secondary | ICD-10-CM | POA: Diagnosis not present

## 2018-09-29 DIAGNOSIS — R112 Nausea with vomiting, unspecified: Secondary | ICD-10-CM | POA: Diagnosis not present

## 2018-09-29 DIAGNOSIS — R42 Dizziness and giddiness: Secondary | ICD-10-CM | POA: Diagnosis not present

## 2018-09-29 LAB — CYTOLOGY - PAP
Chlamydia: NEGATIVE
Diagnosis: NEGATIVE
HPV: NOT DETECTED
Neisseria Gonorrhea: NEGATIVE

## 2018-09-29 MED ORDER — PREDNISONE 10 MG PO TABS: 30.0000 mg | ORAL_TABLET | Freq: Every day | ORAL | 0 refills | Status: AC

## 2018-09-29 NOTE — ED Triage Notes (Signed)
Patient states that she had vertigo that started on Friday and had one episode. Patient states that it returned on Sunday upon wakening. Patient states that she notices her symptoms when she lays flat. Patient reports that she did have an episode of vomiting on Sunday so [facility] told her she cannot come back. Patient states that she was at the beach this weekend but has noticed some body aches.

## 2018-09-29 NOTE — ED Provider Notes (Signed)
[name]    CSN: [id number] Arrival date & time: 09/29/18  1028     History   Chief Complaint Chief Complaint  Patient presents with   Dizziness    HPI Carol Conley is a 28 y.o. female.   28 year old female presents with first episode ever of Vertigo 5 days ago. [place] the beach and started feeling dizzy when lying down. The episode lasted a few minutes then resolved. 2 days later (on Sunday), she had a worse episode and vomited due to the nausea and dizziness. She also had mild body aches and had a hot flash during the episode. She denies any fever, nasal congestion, ear pain, sore throat, cough, difficulty breathing, chest pain or palpitations. She feels better but still is occasionally dizzy and has a mild headache which is worse when she lays her head flat. She is concerned over the continuation of symptoms. She has taken Zofran and Reglan with minimal relief in nausea. She does have a history of migraine headaches which only occur every 1 to 2 months. She takes Imitrex, Excedrin Migraine or Phenergan with success. She also has a history of IBS and gastric disease and currently on Protonix, Pepcid, Bentyl daily. Other meds include Junel 1/20 daily and Allegra and Albuterol prn. She is not sexually active and no possibility of pregnancy.   The history is provided by the patient.    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   Esophageal dysphagia    Problems with swallowing and mastication    Stricture and stenosis of esophagus    Asthma, mild intermittent 08/31/2015   Abdominal pain 10/20/2014    Abdominal pain, epigastric 10/20/2014   Airway hyperreactivity 10/20/2014   Feeling bilious 10/20/2014   Acid reflux 10/20/2014   Hemorrhoids, internal 10/20/2014   Hemorrhoid 10/20/2014   H/O gastrointestinal disease 10/20/2014   Adaptive colitis 10/20/2014   IgA deficiency, isolated (HCC) 10/20/2014   Colitis 03/08/2014    Past Surgical History:  Procedure Laterality Date   ESOPHAGEAL DILATION  04/08/2016   Procedure: ESOPHAGEAL DILATION;  Surgeon: Midge Miniumarren Wohl, MD;  [facility];  Service: Endoscopy;;   ESOPHAGEAL DILATION  12/26/2016   Procedure: ESOPHAGEAL DILATION;  Surgeon: Midge MiniumWohl, Darren, MD;  [facility];  Service: Endoscopy;;   ESOPHAGOGASTRODUODENOSCOPY (EGD) WITH PROPOFOL N/A 04/08/2016   Procedure: ESOPHAGOGASTRODUODENOSCOPY (EGD) WITH PROPOFOL;  Surgeon: Midge Miniumarren Wohl, MD;  [facility];  Service: Endoscopy;  Laterality: N/A;   ESOPHAGOGASTRODUODENOSCOPY (EGD) WITH PROPOFOL N/A 12/26/2016   Procedure: ESOPHAGOGASTRODUODENOSCOPY (EGD) WITH PROPOFOL;  Surgeon: Midge MiniumWohl, Darren, MD;  [facility];  Service: Endoscopy;  Laterality: N/A;  UPREG   OVARIAN CYST REMOVAL Right    2007   [facility]      OB History    Gravida  0   Para  0   Term  0   Preterm  0   AB  0   Living  0     SAB  0   TAB  0   Ectopic  0   Multiple  0   Live Births               Home Medications    [name]   Medication Sig Start Date End Date Taking? Authorizing Provider  [name];[name] Inhale 2 puffs into the lungs every 6 (six) hours as needed for wheezing or shortness of breath. 06/12/17  Yes Zachery DauerGraham, Elysa, NP  aspirin-acetaminophen-caffeine (EXCEDRIN MIGRAINE) [phone] MG per tablet Take 2 tablets by mouth every 8 (eight) hours as needed for migraine.    Yes [provider]  [name]  TAKE 1 CAPSULE BY MOUTH 4 TIMES DAILY - BEFORE MEALS AND AT BEDTIME. 06/18/18  Yes Wohl, Darren, MD  [name] Take 180 mg by mouth daily. As needed   Yes [provider]  fluticasone (FLONASE) 50 MCG/ACT nasal spray Place 2 sprays into both nostrils daily. 11/26/17  Yes Galen ManilaKennedy, Lauren Renee, NP  [name] Place 1 suppository (25 mg total) rectally 2 (two) times daily. 11/09/14  Yes Midge MiniumWohl, Darren, MD  [name] 1-20 MG-MCG tablet TAKE 1 TABLET BY MOUTH DAILY CONTINUOUSLY AS DIRECTED 09/24/18  Yes Shambley, Melody N, CNM  Lidocaine, [name], [name] Relief of anorectal pain and itching: Apply small, pea-sized amount of cream to affected area up to 6 times daily as needed 03/07/15  Yes Danelle Berryapia, Leisa, PA-C  Melatonin 1 MG TABS Take 1-2 tablets (1-2 mg total) by mouth at bedtime. 10/06/17  Yes Galen ManilaKennedy, Lauren Renee, NP  [name] TAKE 1 TABLET BY MOUTH 4 TIMES DAILY. 06/18/18  Yes Wohl, Darren, MD  [name] DISSOLVE 1 TABLET BY MOUTH EVERY 8 HOURS AS NEEDED FOR NAUSEA OR VOMITING 04/02/18  Yes Midge MiniumWohl, Darren, MD  pantoprazole (PROTONIX) 40 MG tablet Take 1 tablet (40 mg total) by mouth 2 (two) times daily. 06/22/18  Yes Midge MiniumWohl, Darren, MD  Pediatric Multivit-Minerals-C (KIDS GUMMY BEAR VITAMINS PO) Take by mouth.   Yes [provider]  [name] Place 1 application rectally 2 (two) times daily as needed for hemorrhoids. 03/07/15  Yes Danelle Berryapia, Leisa, PA-C  [name] Take 1 tablet (25 mg total) by mouth every 6 (six) hours as needed for nausea or vomiting. 04/02/16  Yes Midge MiniumWohl, Darren, MD  SUMAtriptan (IMITREX) 50 MG tablet Take 1 tablet at onset of headache May repeat every 2 hours x 3 total doses in 24 hours if headache persists or returns. 04/10/17  Yes  Galen ManilaKennedy, Lauren Renee, NP  dexlansoprazole ([name]) 60 MG capsule Take 1 capsule (60 mg total) by mouth daily. 09/30/18   Midge MiniumWohl, Darren, MD  predniSONE (DELTASONE) 10 MG tablet Take 3 tablets (30 mg total) by mouth daily for 5 days. 09/29/18 10/04/18  AmyotAli Lowe, Koden Hunzeker Berry, NP    Family History Family History  Problem Relation Age of Onset   Cancer Father        skin   Thyroid disease Mother     Social History Social History   Tobacco Use   Smoking status: Never Smoker   Smokeless tobacco: Never Used  Substance Use Topics   Alcohol use: Yes    Comment: occasionally    [name]     Allergies   Banana, [name], Cantaloupe (diagnostic), and Cefzil [  cefprozil]   Review of Systems Review of Systems  Constitutional: Positive for appetite change and fatigue. Negative for activity change, chills, diaphoresis and fever.  HENT: Positive for sinus pressure. Negative for congestion, ear discharge, ear pain, facial swelling, hearing loss, mouth sores, nosebleeds, postnasal drip, rhinorrhea, sinus pain, sneezing, sore throat, tinnitus and trouble swallowing.   Eyes: Negative for photophobia, pain, discharge, redness, itching and visual disturbance.  Respiratory: Negative for cough, chest tightness, shortness of breath and wheezing.   Cardiovascular: Negative for chest pain and palpitations.  Gastrointestinal: Positive for nausea and vomiting. Negative for abdominal pain and diarrhea.  Musculoskeletal: Negative for arthralgias, myalgias, neck pain and neck stiffness.  Skin: Negative for color change, rash and wound.  Allergic/Immunologic: Positive for environmental allergies and food allergies.  Neurological: Positive for dizziness and headaches. Negative for tremors, seizures, syncope, facial asymmetry, speech difficulty, weakness, light-headedness and numbness.  Hematological: Negative for adenopathy. Does not bruise/bleed easily.     Physical Exam Triage Vital  Signs ED Triage Vitals  Enc Vitals Group     BP 09/29/18 1134 107/82     Pulse Rate 09/29/18 1134 79     Resp 09/29/18 1134 18     Temp 09/29/18 1134 98.4 F (36.9 C)     Temp Source 09/29/18 1134 Oral     SpO2 09/29/18 1134 100 %     Weight 09/29/18 1131 150 lb (68 kg)     Height 09/29/18 1131 5\' 5"  (1.651 m)     Head Circumference --      Peak Flow --      Pain Score 09/29/18 1131 0     Pain Loc --      Pain Edu? --      Excl. in [place]? --    No data found.  Updated Vital Signs BP 107/82 (BP Location: Right Arm)    Pulse 79    Temp 98.4 F (36.9 C) (Oral)    Resp 18    Ht 5\' 5"  (1.651 m)    Wt 150 lb (68 kg)    SpO2 100%    BMI 24.96 kg/m   Visual Acuity Right Eye Distance:   Left Eye Distance:   Bilateral Distance:    Right Eye Near:   Left Eye Near:    Bilateral Near:     Physical Exam Vitals signs and nursing note reviewed.  Constitutional:      General: She is awake. She is not in acute distress.    Appearance: She is well-developed, well-groomed and normal weight. She is not ill-appearing.     Comments: Patient is sitting comfortably on exam table in no acute distress.   HENT:     Head: Normocephalic and atraumatic.     Right Ear: Hearing, tympanic membrane, ear canal and external ear normal.     Left Ear: Hearing, tympanic membrane, ear canal and external ear normal.     Nose: Nose normal.     Right Sinus: No maxillary sinus tenderness or frontal sinus tenderness.     Left Sinus: No maxillary sinus tenderness or frontal sinus tenderness.     Mouth/Throat:     Lips: Pink.     Mouth: Mucous membranes are moist.     Pharynx: Oropharynx is clear. Uvula midline. No pharyngeal swelling, oropharyngeal exudate or posterior oropharyngeal erythema.  Eyes:     General: Lids are normal. Vision grossly intact. Gaze aligned appropriately.     Extraocular Movements: Extraocular movements intact.  Conjunctiva/sclera: Conjunctivae normal.     Pupils: Pupils are equal,  round, and reactive to light.  Neck:     Musculoskeletal: Normal range of motion and neck supple. No neck rigidity or muscular tenderness.     Vascular: No carotid bruit.  Cardiovascular:     Rate and Rhythm: Normal rate and regular rhythm.     Pulses: Normal pulses.     Heart sounds: Normal heart sounds. No murmur.  Pulmonary:     Effort: Pulmonary effort is normal. No respiratory distress.     Breath sounds: Normal breath sounds and air entry. No decreased breath sounds, wheezing, rhonchi or rales.  Musculoskeletal: Normal range of motion.  Lymphadenopathy:     Cervical: No cervical adenopathy.  Skin:    General: Skin is warm and dry.     Capillary Refill: Capillary refill takes less than 2 seconds.     Findings: No erythema or rash.  Neurological:     General: No focal deficit present.     Mental Status: She is alert and oriented to person, place, and time.     Cranial Nerves: Cranial nerves are intact.     Sensory: Sensation is intact.     Motor: Motor function is intact.     Coordination: Coordination is intact.     Gait: Gait is intact.     Comments: Patient slightly unsteady with toe-heel walking but otherwise no other abnormalities noted. Negative Romberg.   Psychiatric:        Mood and Affect: Mood normal.        Behavior: Behavior normal. Behavior is cooperative.        Thought Content: Thought content normal.        Judgment: Judgment normal.      UC Treatments / Results  Labs (all labs ordered are listed, but only abnormal results are displayed) Labs Reviewed - No data to display  EKG   Radiology No results found.  Procedures Procedures (including critical care time)  Medications Ordered in UC Medications - No data to display  Initial Impression / Assessment and Plan / UC Course  I have reviewed the triage vital signs and the nursing notes.  Pertinent labs & imaging results that were available during my care of the patient were reviewed by me and  considered in my medical decision making (see chart for details).    Discussed history and clinical findings with patient-discussed that she probably has benign paroxysmal positional vertigo. Doubt Cardiovascular cause. Discussed that uncertain of trigger. Discussed Epley maneuver which usually has a good success rate. Patient did not want us to perform this maneuver in the clinic- provided information to perform the maneuver at home. Discussed that we can trial Prednisone to help with headache and if condition has an inflammatory component- Prednisone may be helpful. Will start Prednisone 30mg  daily for 5 days then stop- reviewed risks and side effects. May continue her other medications as directed. Note written for work. Also provided information for local ENT if symptoms do not resolve. Follow-up with her PCP in 2 to 3 days for referral to ENT if symptoms persist or go to the ER if symptoms worsen.  Final Clinical Impressions(s) / UC Diagnoses   Final diagnoses:  Vertigo  Other headache syndrome  Intractable vomiting with nausea, unspecified vomiting type     Discharge Instructions     May try Prednisone 30mg  daily for 5 days then stop. Continue other medications for nausea as needed. May take Imitrex  if needed for headache. Change positions slowly. Follow-up with your PCP in 2 to 3 days for referral to ENT if symptoms persist.     ED Prescriptions    Medication Sig Dispense Auth. Provider   predniSONE (DELTASONE) 10 MG tablet Take 3 tablets (30 mg total) by mouth daily for 5 days. 15 tablet Sudie Grumbling, NP     Controlled Substance Prescriptions [place] Controlled Substance Registry consulted? Not Applicable   Sudie Grumbling, NP 09/30/18 2044

## 2018-09-29 NOTE — Discharge Instructions (Addendum)
May try Prednisone 30mg  daily for 5 days then stop. Continue other medications for nausea as needed. May take Imitrex if needed for headache. Change positions slowly. Follow-up with your PCP in 2 to 3 days for referral to ENT if symptoms persist.

## 2018-09-29 NOTE — Telephone Encounter (Signed)
Patient called & would like to talk to Ginger about medication & possibly an appointment.

## 2018-09-30 ENCOUNTER — Other Ambulatory Visit: Payer: Self-pay

## 2018-09-30 MED ORDER — DEXLANSOPRAZOLE 60 MG PO CPDR: 60.0000 mg | DELAYED_RELEASE_CAPSULE | Freq: Every day | ORAL | 5 refills | Status: DC

## 2018-09-30 NOTE — Telephone Encounter (Signed)
Pt has requested to be switched back to [name] 60mg  as her symptoms are still bad on the pantoprazole 40mg  BID and Pepcid.

## 2018-11-18 ENCOUNTER — Encounter: Payer: Self-pay | Admitting: Emergency Medicine

## 2018-11-18 ENCOUNTER — Other Ambulatory Visit: Payer: Self-pay

## 2018-11-18 ENCOUNTER — Emergency Department
Admission: EM | Admit: 2018-11-18 | Discharge: 2018-11-18 | Disposition: A | Discharge status: Home / Self Care | Payer: No Typology Code available for payment source | Attending: Emergency Medicine | Admitting: Emergency Medicine

## 2018-11-18 DIAGNOSIS — J45909 Unspecified asthma, uncomplicated: Secondary | ICD-10-CM | POA: Diagnosis not present

## 2018-11-18 DIAGNOSIS — R109 Unspecified abdominal pain: Secondary | ICD-10-CM

## 2018-11-18 DIAGNOSIS — R1031 Right lower quadrant pain: Secondary | ICD-10-CM | POA: Diagnosis not present

## 2018-11-18 DIAGNOSIS — R197 Diarrhea, unspecified: Secondary | ICD-10-CM | POA: Insufficient documentation

## 2018-11-18 DIAGNOSIS — Z79899 Other long term (current) drug therapy: Secondary | ICD-10-CM | POA: Insufficient documentation

## 2018-11-18 LAB — CBC
HCT: 37.9 % (ref 36.0–46.0)
Hemoglobin: 12.7 g/dL (ref 12.0–15.0)
MCH: 27.3 pg (ref 26.0–34.0)
MCHC: 33.5 g/dL (ref 30.0–36.0)
MCV: 81.5 fL (ref 80.0–100.0)
Platelets: 311 10*3/uL (ref 150–400)
RBC: 4.65 MIL/uL (ref 3.87–5.11)
RDW: 12.9 % (ref 11.5–15.5)
WBC: 8.2 10*3/uL (ref 4.0–10.5)
nRBC: 0 % (ref 0.0–0.2)

## 2018-11-18 LAB — URINALYSIS, COMPLETE (UACMP) WITH MICROSCOPIC
Bilirubin Urine: NEGATIVE
Glucose, UA: NEGATIVE mg/dL
Hgb urine dipstick: NEGATIVE
Ketones, ur: NEGATIVE mg/dL
Leukocytes,Ua: NEGATIVE
Nitrite: NEGATIVE
Protein, ur: NEGATIVE mg/dL
Specific Gravity, Urine: 1.021 (ref 1.005–1.030)
pH: 6 (ref 5.0–8.0)

## 2018-11-18 LAB — COMPREHENSIVE METABOLIC PANEL
ALT: 12 U/L (ref 0–44)
AST: 17 U/L (ref 15–41)
Albumin: 3.7 g/dL (ref 3.5–5.0)
Alkaline Phosphatase: 71 U/L (ref 38–126)
Anion gap: 9 (ref 5–15)
BUN: 16 mg/dL (ref 6–20)
CO2: 23 mmol/L (ref 22–32)
Calcium: 9.3 mg/dL (ref 8.9–10.3)
Chloride: 108 mmol/L (ref 98–111)
Creatinine, Ser: 0.91 mg/dL (ref 0.44–1.00)
GFR calc Af Amer: >60 mL/min (ref >60)
GFR calc non Af Amer: >60 mL/min (ref >60)
Glucose, Bld: 104 mg/dL — ABNORMAL HIGH (ref 70–99)
Potassium: 3.9 mmol/L (ref 3.5–5.1)
Sodium: 140 mmol/L (ref 135–145)
Total Bilirubin: 0.3 mg/dL (ref 0.3–1.2)
Total Protein: 7.4 g/dL (ref 6.5–8.1)

## 2018-11-18 LAB — PREGNANCY, URINE: Preg Test, Ur: NEGATIVE

## 2018-11-18 LAB — LIPASE, BLOOD: Lipase: 26 U/L (ref 11–51)

## 2018-11-18 MED ORDER — SODIUM CHLORIDE 0.9 % IV BOLUS
1000.0000 mL | Freq: Once | INTRAVENOUS | Status: AC
  Administered 2018-11-18: 1000 mL via INTRAVENOUS

## 2018-11-18 MED ORDER — HALOPERIDOL LACTATE 5 MG/ML IJ SOLN
5.0000 mg | Freq: Once | INTRAMUSCULAR | Status: AC
  Administered 2018-11-18: 5 mg via INTRAVENOUS
  Filled 2018-11-18: qty 1

## 2018-11-18 NOTE — ED Triage Notes (Signed)
Patient presents to ED from work with c/o sudden onset RLQ pain that radiates to LLQ. Patient reports history of IBS. Patient reports nausea, denies diarrhea or vomiting.

## 2018-11-18 NOTE — ED Notes (Signed)
Pt verbalized understanding of discharge instructions. NAD at this time. 

## 2018-11-18 NOTE — ED Provider Notes (Signed)
[facility] Emergency Department Provider Note   ____________________________________________   I have reviewed the triage vital signs and the nursing notes.   HISTORY  Chief Complaint Abdominal Pain   History limited by: Not Limited   HPI Carol Conley is a 28 y.o. female who presents to the emergency department today because of concern for abdominal pain. The pain started today. Located primarily in the right lower quadrant, there is some radiation across her lower abdomen. The patient states that she has IBS and did have some constipation recently but then had diarrhea today after the pain started. Felt slight improvement after diarrhea. Tried taking bentyl and zofran today without any significant relief of symptoms. The patient denies any unusual ingestions. Denies any fevers.    Records reviewed. Per medical record review patient has a history of IBS, ovarian cyst.   Past Medical History:  Diagnosis Date  . Allergy   . Asthma    as child  . Colitis   . GERD (gastroesophageal reflux disease)   . Headache    sinus  . IBS (irritable bowel syndrome)   . IgA deficiency, selective (HCC)   . Motion sickness    cars, planes  . Sinusitis     Patient Active Problem List   Diagnosis Date Noted  . 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 Esophageal dysphagia   . Problems with swallowing and mastication   . Stricture and stenosis of esophagus   . Asthma, mild intermittent 08/31/2015  . Abdominal pain 10/20/2014  . Abdominal pain, epigastric 10/20/2014  . 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 Feeling bilious 10/20/2014  . Acid reflux 10/20/2014  . Hemorrhoids, internal 10/20/2014  . Hemorrhoid 10/20/2014  . H/O gastrointestinal disease 10/20/2014  . 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 IgA deficiency, isolated (HCC) 10/20/2014  . Colitis 03/08/2014    Past Surgical History:  Procedure Laterality Date  . ESOPHAGEAL DILATION   04/08/2016   Procedure: ESOPHAGEAL DILATION;  Surgeon: Midge Miniumarren Wohl, MD;  [facility];  Service: Endoscopy;;  . ESOPHAGEAL DILATION  12/26/2016   Procedure: ESOPHAGEAL DILATION;  Surgeon: Midge MiniumWohl, Darren, MD;  [facility];  Service: Endoscopy;;  . ESOPHAGOGASTRODUODENOSCOPY (EGD) WITH PROPOFOL N/A 04/08/2016   Procedure: ESOPHAGOGASTRODUODENOSCOPY (EGD) WITH PROPOFOL;  Surgeon: Midge Miniumarren Wohl, MD;  [facility];  Service: Endoscopy;  Laterality: N/A;  . ESOPHAGOGASTRODUODENOSCOPY (EGD) WITH PROPOFOL N/A 12/26/2016   Procedure: ESOPHAGOGASTRODUODENOSCOPY (EGD) WITH PROPOFOL;  Surgeon: Midge MiniumWohl, Darren, MD;  [facility];  Service: Endoscopy;  Laterality: N/A;  UPREG  . OVARIAN CYST REMOVAL Right    2007  . [facility]      [name]   Medication Sig Start Date End Date Taking? Authorizing Provider  [name];[name] Inhale 2 puffs into the lungs every 6 (six) hours as needed for wheezing or shortness of breath. 06/12/17   Zachery DauerGraham, Elysa, NP  aspirin-acetaminophen-caffeine (EXCEDRIN MIGRAINE) [phone] MG per tablet Take 2 tablets by mouth every 8 (eight) hours as needed for migraine.     [provider]  dexlansoprazole ([name]) 60 MG capsule Take 1 capsule (60 mg total) by mouth daily. 09/30/18   Midge MiniumWohl, Darren, MD  [name] TAKE 1 CAPSULE BY MOUTH 4 TIMES DAILY - BEFORE MEALS AND AT BEDTIME. 06/18/18   Midge MiniumWohl, Darren, MD  [name] Take 180 mg by mouth daily. As needed  [provider]  fluticasone (FLONASE) 50 MCG/ACT nasal spray Place 2 sprays into both nostrils daily. 11/26/17   Galen Manila, NP  [name] Place 1 suppository (25 mg total) rectally 2 (two) times daily. 11/09/14   [name], MD  [name] 1-20 MG-MCG tablet TAKE 1 TABLET BY MOUTH  DAILY CONTINUOUSLY AS DIRECTED 09/24/18   Shambley, Melody N, CNM  Lidocaine, [name], [name] Relief of anorectal pain and itching: Apply small, pea-sized amount of cream to affected area up to 6 times daily as needed 03/07/15   Danelle Berry, PA-C  Melatonin 1 MG TABS Take 1-2 tablets (1-2 mg total) by mouth at bedtime. 10/06/17   Galen Manila, NP  [name] TAKE 1 TABLET BY MOUTH 4 TIMES DAILY. 06/18/18   [name], MD  [name] DISSOLVE 1 TABLET BY MOUTH EVERY 8 HOURS AS NEEDED FOR NAUSEA OR VOMITING 04/02/18   [name], MD  pantoprazole (PROTONIX) 40 MG tablet Take 1 tablet (40 mg total) by mouth 2 (two) times daily. 06/22/18   [name], MD  Pediatric Multivit-Minerals-C (KIDS GUMMY BEAR VITAMINS PO) Take by mouth.    [provider]  [name] Place 1 application rectally 2 (two) times daily as needed for hemorrhoids. 03/07/15   Danelle Berry, PA-C  [name] Take 1 tablet (25 mg total) by mouth every 6 (six) hours as needed for nausea or vomiting. 04/02/16   [name], MD  SUMAtriptan (IMITREX) 50 MG tablet Take 1 tablet at onset of headache May repeat every 2 hours x 3 total doses in 24 hours if headache persists or returns. 04/10/17   Galen Manila, NP    Allergies Banana, [name], Cantaloupe (diagnostic), and [name]  Family History  Problem Relation Age of Onset  . Cancer Father        skin  . Thyroid disease Mother     Social History Social History   Tobacco Use  . Smoking status: Never Smoker  . Smokeless tobacco: Never Used  Substance Use Topics  . Alcohol use: Yes    Comment: occasionally   . [name]    Review of Systems Constitutional: No fever/chills Eyes: No visual changes. ENT: No sore throat. Cardiovascular:  Denies chest pain. Respiratory: Denies shortness of breath. Gastrointestinal: Positive for abdominal pain, diarrhea.  Genitourinary: Negative for dysuria. Musculoskeletal: Negative for back pain. Skin: Negative for rash. Neurological: Negative for headaches, focal weakness or numbness.  ____________________________________________   PHYSICAL EXAM:  VITAL SIGNS: ED Triage Vitals [11/18/18 1349]  Enc Vitals Group     BP 132/85     Pulse Rate 98     Resp 17     Temp 98.3 F (36.8 C)     Temp Source Oral     SpO2 99 %     Weight 150 lb (68 kg)     Height 5\' 5"  (1.651 m)     Head Circumference      Peak Flow      Pain Score 6   Constitutional: Alert and oriented.  Eyes: Conjunctivae are normal.  ENT      Head: Normocephalic and atraumatic.      Nose: No congestion/rhinnorhea.      Mouth/Throat: Mucous membranes are moist.      Neck: No stridor. Hematological/Lymphatic/Immunilogical: No cervical lymphadenopathy. Cardiovascular:  Normal rate, regular rhythm.  No murmurs, rubs, or gallops.  Respiratory: Normal respiratory effort without tachypnea nor retractions. Breath sounds are clear and equal bilaterally. No wheezes/rales/rhonchi. Gastrointestinal: Soft and tender to palpation in the lower abdomen, worse in the right lower quadrant.  Genitourinary: Deferred Musculoskeletal: Normal range of motion in all extremities. No lower extremity edema. Neurologic:  Normal speech and language. No gross focal neurologic deficits are appreciated.  Skin:  Skin is warm, dry and intact. No rash noted. Psychiatric: Mood and affect are normal. Speech and behavior are normal. Patient exhibits appropriate insight and judgment.  ____________________________________________    LABS (pertinent positives/negatives)  Lipase 26 CBC wbc 8.2, hgb 12.7, plt 311 CMP wnl except glu 104  ____________________________________________   EKG  None  ____________________________________________     RADIOLOGY  None  ____________________________________________   PROCEDURES  Procedures  ____________________________________________   INITIAL IMPRESSION / ASSESSMENT AND PLAN / ED COURSE  Pertinent labs & imaging results that were available during my care of the patient were reviewed by me and considered in my medical decision making (see chart for details).   Patient presents to the emergency department today with concerns for abdominal pain.  Patient states she has a history of IBS.  Patient's blood work without concerning leukocytosis.  Patient is afebrile here.  Did feel better after IV fluids and medication.  At this point I think IBS likely cause of the patient's symptoms.  Did discuss this with the patient.  She did feel comfortable going home given that she was feeling better.   ____________________________________________   FINAL CLINICAL IMPRESSION(S) / ED DIAGNOSES  Final diagnoses:  Abdominal pain, unspecified abdominal location     Note: This dictation was prepared with Dragon dictation. Any transcriptional errors that result from this process are unintentional     Nance Pear, MD 11/18/18 2004

## 2018-11-18 NOTE — Discharge Instructions (Addendum)
Please seek medical attention for any high fevers, chest pain, shortness of breath, change in behavior, persistent vomiting, bloody stool or any other new or concerning symptoms.  

## 2018-11-27 ENCOUNTER — Ambulatory Visit: Payer: No Typology Code available for payment source | Admitting: Nurse Practitioner

## 2018-12-01 ENCOUNTER — Other Ambulatory Visit: Payer: Self-pay | Admitting: Nurse Practitioner

## 2018-12-01 DIAGNOSIS — J301 Allergic rhinitis due to pollen: Secondary | ICD-10-CM

## 2018-12-01 DIAGNOSIS — J0100 Acute maxillary sinusitis, unspecified: Secondary | ICD-10-CM

## 2018-12-08 ENCOUNTER — Ambulatory Visit (INDEPENDENT_AMBULATORY_CARE_PROVIDER_SITE_OTHER): Payer: No Typology Code available for payment source | Admitting: Nurse Practitioner

## 2018-12-08 ENCOUNTER — Encounter: Payer: Self-pay | Admitting: Nurse Practitioner

## 2018-12-08 ENCOUNTER — Other Ambulatory Visit: Payer: Self-pay

## 2018-12-08 VITALS — BP 104/70 | HR 86 | Ht 65.0 in | Wt 155.8 lb

## 2018-12-08 DIAGNOSIS — F419 Anxiety disorder, unspecified: Secondary | ICD-10-CM

## 2018-12-08 DIAGNOSIS — K589 Irritable bowel syndrome without diarrhea: Secondary | ICD-10-CM

## 2018-12-08 NOTE — Patient Instructions (Addendum)
Marlis Edelson Sagona,   Thank you for coming in to clinic today.  1. START citrucel - 1/4 dose to start and increase as tolerated and as needed.  This may help regulate stool consistency.  2. On day 3-4 of constipation, add stool softener (Colace or docusate sodium) for 1-2 days to help with stool consistency and promotion of sooner BM.    3. Mindfulness - read and practice being present "right here, right now." Headspace - free 30-day trial  Then look up other mindfulness apps and go through free trials.  4. [place] Internal Referral -  Referral placed today, they will call you Buena Irish, [place]  Self Referral: 1. Karen [place]   Address: [place], [place], [place] [address] Hours: Open today  9AM-7PM Phone: [phone]  2. [place], [place] Address: [address] [place], [place],  [address] Phone: [phone]  Please schedule a follow-up appointment with Cassell Smiles, AGNP. Return if symptoms worsen or fail to improve.  If you have any other questions or concerns, please feel free to call the clinic or send a message through [place]. You may also schedule an earlier appointment if necessary.  You will receive a survey after today's visit either digitally by e-mail or paper by [employer]. Your experiences and feedback matter to [place].  Please respond so we know how we are doing as we provide care for you.  Cassell Smiles, DNP, AGNP-BC Adult Gerontology Nurse Practitioner [place]
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
## 2018-12-11 ENCOUNTER — Encounter: Payer: Self-pay | Admitting: Nurse Practitioner

## 2019-01-11 ENCOUNTER — Telehealth: Payer: Self-pay | Admitting: Gastroenterology

## 2019-01-11 NOTE — Telephone Encounter (Signed)
Pt left vm she is trying to get in touch with [facility] regarding some rx questions as well as questions about scheduling with a specialist for IBS

## 2019-01-12 ENCOUNTER — Ambulatory Visit (INDEPENDENT_AMBULATORY_CARE_PROVIDER_SITE_OTHER): Payer: No Typology Code available for payment source | Admitting: Psychology

## 2019-01-12 DIAGNOSIS — F411 Generalized anxiety disorder: Secondary | ICD-10-CM

## 2019-01-12 NOTE — Telephone Encounter (Signed)
Discussed  PPI medication with pt and scheduled a new patient appt with Dr. Marius Ditch.

## 2019-01-18 ENCOUNTER — Ambulatory Visit
Admission: EM | Admit: 2019-01-18 | Discharge: 2019-01-18 | Disposition: A | Discharge status: Home / Self Care | Payer: No Typology Code available for payment source | Attending: Family Medicine | Admitting: Family Medicine

## 2019-01-18 ENCOUNTER — Other Ambulatory Visit: Payer: Self-pay

## 2019-01-18 ENCOUNTER — Encounter: Payer: Self-pay | Admitting: Emergency Medicine

## 2019-01-18 ENCOUNTER — Telehealth: Payer: Self-pay

## 2019-01-18 DIAGNOSIS — J0110 Acute frontal sinusitis, unspecified: Secondary | ICD-10-CM | POA: Diagnosis not present

## 2019-01-18 DIAGNOSIS — J0100 Acute maxillary sinusitis, unspecified: Secondary | ICD-10-CM | POA: Diagnosis not present

## 2019-01-18 DIAGNOSIS — J029 Acute pharyngitis, unspecified: Secondary | ICD-10-CM | POA: Diagnosis not present

## 2019-01-18 DIAGNOSIS — R519 Headache, unspecified: Secondary | ICD-10-CM | POA: Diagnosis not present

## 2019-01-18 LAB — RAPID STREP SCREEN (MED CTR MEBANE ONLY): Streptococcus, Group A Screen (Direct): NEGATIVE

## 2019-01-18 LAB — RAPID INFLUENZA A&B ANTIGENS
Influenza A (ARMC): NEGATIVE
Influenza B (ARMC): NEGATIVE

## 2019-01-18 MED ORDER — AMOXICILLIN 875 MG PO TABS: 875.0000 mg | ORAL_TABLET | Freq: Two times a day (BID) | ORAL | 0 refills | Status: DC

## 2019-01-18 NOTE — Telephone Encounter (Signed)
I spoke with the patient concerning her symptoms. An appointment was offered for tomorrow and the patient declined. I informed her that the schedule was filled up for today. I also informed the patient that the other option was going to urgent care. She decided to just go to the Urgent Care today.

## 2019-01-18 NOTE — Discharge Instructions (Signed)
Over the counter Flonase nasal spray and cough medication, rest, fluids

## 2019-01-18 NOTE — ED Triage Notes (Signed)
Patient in today with a 6 day history of headache, sore throat and body aches. Patient denies fever. Patient had a negative covid test on 01/14/19.

## 2019-01-18 NOTE — ED Provider Notes (Signed)
[name]    CSN: [id number] Arrival date & time: 01/18/19  1247      History   Chief Complaint Chief Complaint  Patient presents with  . Headache  . Sore Throat  . Generalized Body Aches    HPI Cinda Hara is a 28 y.o. female.   28 yo female with a c/o 10 days h/o sinus headaches, sore throat, body aches. Denies any fevers, chills, chest pains, shortness of breath. Patient had a negative covid test last week.    Headache Sore Throat Associated symptoms include headaches.    Past Medical History:  Diagnosis Date  . Allergy   . Asthma    as child  . Colitis   . GERD (gastroesophageal reflux disease)   . Headache    sinus  . IBS (irritable bowel syndrome)   . IgA deficiency, selective (HCC)   . Motion sickness    cars, planes  . Sinusitis     Patient Active Problem List   Diagnosis Date Noted  . 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 Esophageal dysphagia   . Problems with swallowing and mastication   . Stricture and stenosis of esophagus   . Asthma, mild intermittent 08/31/2015  . Abdominal pain 10/20/2014  . Abdominal pain, epigastric 10/20/2014  . 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 Feeling bilious 10/20/2014  . Acid reflux 10/20/2014  . Hemorrhoids, internal 10/20/2014  . Hemorrhoid 10/20/2014  . H/O gastrointestinal disease 10/20/2014  . 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 IgA deficiency, isolated (HCC) 10/20/2014  . Colitis 03/08/2014    Past Surgical History:  Procedure Laterality Date  . ESOPHAGEAL DILATION  04/08/2016   Procedure: ESOPHAGEAL DILATION;  Surgeon: [name], MD;  Location: [facility];  Service: Endoscopy;;  . ESOPHAGEAL DILATION  12/26/2016   Procedure: ESOPHAGEAL DILATION;  Surgeon: [name], MD;  Location: [facility];  Service: Endoscopy;;  . ESOPHAGOGASTRODUODENOSCOPY (EGD) WITH PROPOFOL N/A 04/08/2016   Procedure: ESOPHAGOGASTRODUODENOSCOPY (EGD) WITH  PROPOFOL;  Surgeon: [name], MD;  Location: [facility];  Service: Endoscopy;  Laterality: N/A;  . ESOPHAGOGASTRODUODENOSCOPY (EGD) WITH PROPOFOL N/A 12/26/2016   Procedure: ESOPHAGOGASTRODUODENOSCOPY (EGD) WITH PROPOFOL;  Surgeon: [name], MD;  Location: [facility];  Service: Endoscopy;  Laterality: N/A;  UPREG  . OVARIAN CYST REMOVAL Right    2007  . [facility]      OB History    Gravida  0   Para  0   Term  0   Preterm  0   AB  0   Living  0     SAB  0   TAB  0   Ectopic  0   Multiple  0   Live Births               Home Medications    [name]   Medication Sig Start Date End Date Taking? Authorizing Provider  [name];[name] Inhale 2 puffs into the lungs every 6 (six) hours as needed for wheezing or shortness of breath. 06/12/17  Yes Zachery Dauer, NP  aspirin-acetaminophen-caffeine (EXCEDRIN MIGRAINE) [phone] MG per tablet Take 2 tablets by mouth every 8 (eight) hours as needed for migraine.    Yes [provider]  dexlansoprazole ([name]) 60 MG capsule Take 1 capsule (60 mg total) by mouth daily. 09/30/18  Yes [name], MD  [name] TAKE 1 CAPSULE BY MOUTH  4 TIMES DAILY - BEFORE MEALS AND AT BEDTIME. 06/18/18  Yes Wohl, Darren, MD  fluticasone (FLONASE) 50 MCG/ACT nasal spray Place 2 sprays into both nostrils daily. 11/26/17  Yes Galen ManilaKennedy, Lauren Renee, NP  [name] Place 1 suppository (25 mg total) rectally 2 (two) times daily. 11/09/14  Yes Midge MiniumWohl, Darren, MD  [name] 1-20 MG-MCG tablet TAKE 1 TABLET BY MOUTH DAILY CONTINUOUSLY AS DIRECTED 09/24/18  Yes Shambley, Melody N, CNM  levocetirizine (XYZAL) 5 MG tablet TAKE 1 TABLET BY MOUTH EVERY EVENING 12/01/18  Yes Galen ManilaKennedy, Lauren Renee, NP  Lidocaine, [name], [name] Relief of anorectal pain and itching: Apply small, pea-sized amount  of cream to affected area up to 6 times daily as needed 03/07/15  Yes Danelle Berryapia, Leisa, PA-C  Melatonin 1 MG TABS Take 1-2 tablets (1-2 mg total) by mouth at bedtime. 10/06/17  Yes Galen ManilaKennedy, Lauren Renee, NP  [name] TAKE 1 TABLET BY MOUTH 4 TIMES DAILY. Patient taking differently: Take 5 mg by mouth 4 (four) times daily as needed.  06/18/18  Yes Midge MiniumWohl, Darren, MD  [name] DISSOLVE 1 TABLET BY MOUTH EVERY 8 HOURS AS NEEDED FOR NAUSEA OR VOMITING 04/02/18  Yes Midge MiniumWohl, Darren, MD  Pediatric Multivit-Minerals-C (KIDS GUMMY BEAR VITAMINS PO) Take by mouth.   Yes [provider]  [name] Place 1 application rectally 2 (two) times daily as needed for hemorrhoids. 03/07/15  Yes Danelle Berryapia, Leisa, PA-C  [name] Take 1 tablet (25 mg total) by mouth every 6 (six) hours as needed for nausea or vomiting. 04/02/16  Yes Midge MiniumWohl, Darren, MD  SUMAtriptan (IMITREX) 50 MG tablet Take 1 tablet at onset of headache May repeat every 2 hours x 3 total doses in 24 hours if headache persists or returns. 04/10/17  Yes Galen ManilaKennedy, Lauren Renee, NP  amoxicillin (AMOXIL) 875 MG tablet Take 1 tablet (875 mg total) by mouth 2 (two) times daily. 01/18/19   Payton Mccallumonty, Julita Ozbun, MD    Family History Family History  Problem Relation Age of Onset  . Cancer Father        skin  . Thyroid disease Mother     Social History Social History   Tobacco Use  . Smoking status: Never Smoker  . Smokeless tobacco: Never Used  Substance Use Topics  . Alcohol use: Yes    Comment: occasionally   . [name]     Allergies   Banana, [name], Cantaloupe (diagnostic), and [name]   Review of Systems Review of Systems  Neurological: Positive for headaches.     Physical Exam Triage Vital Signs ED Triage Vitals  Enc Vitals Group      BP 01/18/19 1301 121/81     Pulse Rate 01/18/19 1301 (!) 109     Resp 01/18/19 1301 16     Temp 01/18/19 1301 98.4 F (36.9 C)     Temp Source 01/18/19 1301 Oral     SpO2 01/18/19 1301 99 %     Weight 01/18/19 1302 150 lb (68 kg)     Height 01/18/19 1302 5\' 5"  (1.651 m)     Head Circumference --      Peak Flow --      Pain Score 01/18/19 1301 3     Pain Loc --      Pain Edu? --      Excl.  in [place]? --    No data found.  Updated Vital Signs BP 121/81 (BP Location: Left Arm)   Pulse (!) 109   Temp 98.4 F (36.9 C) (Oral)   Resp 16   Ht 5\' 5"  (1.651 m)   Wt 68 kg   SpO2 99%   BMI 24.96 kg/m   Visual Acuity Right Eye Distance:   Left Eye Distance:   Bilateral Distance:    Right Eye Near:   Left Eye Near:    Bilateral Near:     Physical Exam Vitals signs and nursing note reviewed.  Constitutional:      General: She is not in acute distress.    Appearance: She is not toxic-appearing or diaphoretic.  HENT:     Right Ear: Tympanic membrane normal.     Left Ear: Tympanic membrane normal.     Nose:     Right Sinus: Maxillary sinus tenderness and frontal sinus tenderness present.     Left Sinus: Maxillary sinus tenderness and frontal sinus tenderness present.  Cardiovascular:     Rate and Rhythm: Normal rate.     Heart sounds: Normal heart sounds.  Pulmonary:     Effort: Pulmonary effort is normal. No respiratory distress.     Breath sounds: Normal breath sounds. No stridor. No wheezing, rhonchi or rales.  Neurological:     Mental Status: She is alert.      UC Treatments / Results  Labs (all labs ordered are listed, but only abnormal results are displayed) Labs Reviewed  RAPID STREP SCREEN ([name] ONLY)  RAPID INFLUENZA A&B ANTIGENS ([facility] ONLY)  CULTURE, GROUP A STREP [facility])    EKG   Radiology No results found.  Procedures Procedures (including critical care time)  Medications Ordered in UC Medications - No data to display  Initial  Impression / Assessment and Plan / UC Course  I have reviewed the triage vital signs and the nursing notes.  Pertinent labs & imaging results that were available during my care of the patient were reviewed by me and considered in my medical decision making (see chart for details).      Final Clinical Impressions(s) / UC Diagnoses   Final diagnoses:  Acute maxillary sinusitis, recurrence not specified  Acute frontal sinusitis, recurrence not specified     Discharge Instructions     Over the counter Flonase nasal spray and cough medication, rest, fluids    ED Prescriptions    Medication Sig Dispense Auth. Provider   amoxicillin (AMOXIL) 875 MG tablet Take 1 tablet (875 mg total) by mouth 2 (two) times daily. 20 tablet Norval Gable, MD      1. Lab results and diagnosis reviewed with patient 2. rx as per orders above; reviewed possible side effects, interactions, risks and benefits  3. Recommend supportive treatment as above 4. Follow-up prn if symptoms worsen or don't improve  PDMP not reviewed this encounter.   Norval Gable, MD 01/18/19 1406

## 2019-01-18 NOTE — Telephone Encounter (Signed)
Patient called reporting that she has had body aches, sinus and headaches for 1 week.  She reports that she was tested for COVID on 01/14/19 and was Negative.  However, she is still having these symptoms.  I explained to patient we did not have any appointments that urgent care would be a good option if she wants to be seen today.  Please advise

## 2019-01-19 ENCOUNTER — Ambulatory Visit (INDEPENDENT_AMBULATORY_CARE_PROVIDER_SITE_OTHER): Payer: No Typology Code available for payment source | Admitting: Psychology

## 2019-01-19 DIAGNOSIS — F411 Generalized anxiety disorder: Secondary | ICD-10-CM | POA: Diagnosis not present

## 2019-01-21 LAB — CULTURE, GROUP A STREP (THRC)

## 2019-02-02 ENCOUNTER — Other Ambulatory Visit: Payer: Self-pay | Admitting: Gastroenterology

## 2019-02-02 ENCOUNTER — Ambulatory Visit (INDEPENDENT_AMBULATORY_CARE_PROVIDER_SITE_OTHER): Payer: No Typology Code available for payment source | Admitting: Psychology

## 2019-02-02 DIAGNOSIS — F411 Generalized anxiety disorder: Secondary | ICD-10-CM | POA: Diagnosis not present

## 2019-02-02 DIAGNOSIS — R110 Nausea: Secondary | ICD-10-CM

## 2019-02-02 NOTE — Telephone Encounter (Signed)
Are you okay refilling both of these?

## 2019-02-24 ENCOUNTER — Ambulatory Visit (INDEPENDENT_AMBULATORY_CARE_PROVIDER_SITE_OTHER): Payer: No Typology Code available for payment source | Admitting: Gastroenterology

## 2019-02-24 ENCOUNTER — Other Ambulatory Visit
Admission: RE | Admit: 2019-02-24 | Discharge: 2019-02-24 | Disposition: A | Discharge status: Home / Self Care | Payer: No Typology Code available for payment source | Attending: Gastroenterology | Admitting: Gastroenterology

## 2019-02-24 ENCOUNTER — Other Ambulatory Visit: Payer: Self-pay

## 2019-02-24 ENCOUNTER — Encounter: Payer: Self-pay | Admitting: Gastroenterology

## 2019-02-24 VITALS — BP 110/76 | HR 105 | Temp 97.6°F | Ht 65.0 in | Wt 158.4 lb

## 2019-02-24 DIAGNOSIS — R1013 Epigastric pain: Secondary | ICD-10-CM

## 2019-02-24 DIAGNOSIS — D508 Other iron deficiency anemias: Secondary | ICD-10-CM | POA: Diagnosis not present

## 2019-02-24 DIAGNOSIS — K30 Functional dyspepsia: Secondary | ICD-10-CM

## 2019-02-24 DIAGNOSIS — K602 Anal fissure, unspecified: Secondary | ICD-10-CM | POA: Diagnosis not present

## 2019-02-24 DIAGNOSIS — R1110 Vomiting, unspecified: Secondary | ICD-10-CM

## 2019-02-24 DIAGNOSIS — K580 Irritable bowel syndrome with diarrhea: Secondary | ICD-10-CM

## 2019-02-24 LAB — FERRITIN: Ferritin: 8 ng/mL — ABNORMAL LOW (ref 11–307)

## 2019-02-24 LAB — IRON AND TIBC
Iron: 119 ug/dL (ref 28–170)
Saturation Ratios: 23 % (ref 10.4–31.8)
TIBC: 523 ug/dL — ABNORMAL HIGH (ref 250–450)
UIBC: 404 ug/dL

## 2019-02-24 LAB — C-REACTIVE PROTEIN: CRP: 0.9 mg/dL (ref <1.0)

## 2019-02-24 NOTE — Progress Notes (Signed)
Arlyss Repress, MD [address]  [address]  [place], [place] [address]  Main: [phone]  Fax: [phone]    Gastroenterology Consultation  Referring Provider:     Galen Manila, * Primary Care Physician:  Galen Manila, NP (Inactive) Primary Gastroenterologist:  Dr. [name] Reason for Consultation: Dyspepsia        HPI:   Carol Conley is a 28 y.o. female referred by Dr. Kyung Rudd, Alison Stalling, NP (Inactive)  for consultation & management of dyspepsia.  Patient reports that she has been experiencing constellation of gastrointestinal symptoms including early morning nausea, abdominal bloating, postprandial diarrhea, left upper quadrant pain, severe rectal pain.  Patient reports that she has been suffering from constipation since her childhood and she has GI issues growing up.  She acknowledges drinking soft drinks and fruit juices several times a day.  She does not drink water.  She underwent EGD for globus sensation/difficulty swallowing in the past and was dilated twice.  She had esophageal and duodenal biopsies which were unremarkable.  She underwent gastric emptying study which revealed mild delayed gastric emptying at 4-hour interval for which she started on Reglan 5 mg as needed.  Patient reports that Reglan provides relief of nausea.  However she continues to have other GI symptoms.  She is currently on [name] for regurgitation.  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  She denies tobacco use, alcohol use  She denies family history of GI malignancy, inflammatory bowel disease in first-degree relatives NSAIDs: None  Antiplts/Anticoagulants/Anti thrombotics: None  GI Procedures:  EGD 12/2016 - Small hiatal hernia. - Benign-appearing  esophageal stenosis. Dilated. - Food (residue) in the stomach. - Normal examined duodenum. - Biopsy performed in the middle third of the esophagus.    EGD 03/2016 - Benign-appearing esophageal stenosis. Dilated. - Small hiatal hernia. - Normal stomach. - Normal examined duodenum. Biopsied.  DIAGNOSIS:  A. SMALL BOWEL; BIOPSY:  - SMALL BOWEL MUCOSA WITH INTACT VILLOUS ARCHITECTURE.  - NEGATIVE FOR INTRAEPITHELIAL LYMPHOCYTOSIS, DYSPLASIA AND MALIGNANCY.   B. MID ESOPHAGUS; BIOPSY:  - NO PATHOLOGIC CHANGE.  Past Medical History:  Diagnosis Date  . Allergy   . Asthma    as child  . Colitis   . GERD (gastroesophageal reflux disease)   . Headache    sinus  . IBS (irritable bowel syndrome)   . IgA deficiency, selective ([facility])   . Motion sickness    cars, planes  . Sinusitis     Past Surgical History:  Procedure Laterality Date  . ESOPHAGEAL DILATION  04/08/2016   Procedure: ESOPHAGEAL DILATION;  Surgeon: [name], MD;  Location: [facility];  Service: Endoscopy;;  . ESOPHAGEAL DILATION  12/26/2016   Procedure: ESOPHAGEAL DILATION;  Surgeon: [name], MD;  Location: [facility];  Service: Endoscopy;;  . ESOPHAGOGASTRODUODENOSCOPY (EGD) WITH PROPOFOL N/A 04/08/2016   Procedure: ESOPHAGOGASTRODUODENOSCOPY (EGD) WITH PROPOFOL;  Surgeon: [name], MD;  Location: [facility];  Service: Endoscopy;  Laterality: N/A;  . ESOPHAGOGASTRODUODENOSCOPY (EGD) WITH PROPOFOL N/A 12/26/2016   Procedure: ESOPHAGOGASTRODUODENOSCOPY (EGD) WITH PROPOFOL;  Surgeon: [name], MD;  Location: [facility];  Service: Endoscopy;  Laterality: N/A;  UPREG  . OVARIAN CYST REMOVAL Right    2007  . [facility]  Current Outpatient Medications:  .  [name];[name], Inhale 2 puffs into the lungs every 6 (six) hours as needed for wheezing or shortness of breath., Disp: 1 Inhaler, Rfl: 0 .   aspirin-acetaminophen-caffeine (EXCEDRIN MIGRAINE) 250-250-65 MG per tablet, Take 2 tablets by mouth every 8 (eight) hours as needed for migraine. , Disp: , Rfl:  .  dexlansoprazole ([name]) 60 MG capsule, Take 1 capsule (60 mg total) by mouth daily., Disp: 30 capsule, Rfl: 5 .  [name], TAKE 1 CAPSULE BY MOUTH 4 TIMES DAILY - BEFORE MEALS AND AT BEDTIME., Disp: 90 capsule, Rfl: 3 .  fluticasone (FLONASE) 50 MCG/ACT nasal spray, Place 2 sprays into both nostrils daily., Disp: 16 g, Rfl: 2 .  [name], Place 1 suppository (25 mg total) rectally 2 (two) times daily., Disp: 14 suppository, Rfl: 2 .  [name] 1-20 MG-MCG tablet, TAKE 1 TABLET BY MOUTH DAILY CONTINUOUSLY AS DIRECTED, Disp: 3 Package, Rfl: 4 .  levocetirizine (XYZAL) 5 MG tablet, TAKE 1 TABLET BY MOUTH EVERY EVENING, Disp: 30 tablet, Rfl: 5 .  Lidocaine, [name], [name], Relief of anorectal pain and itching: Apply small, pea-sized amount of cream to affected area up to 6 times daily as needed, Disp: 30 g, Rfl: 1 .  Melatonin 1 MG TABS, Take 1-2 tablets (1-2 mg total) by mouth at bedtime., Disp: , Rfl:  .  [name], TAKE 1 TABLET BY MOUTH 4 TIMES DAILY. (Patient taking differently: Take 5 mg by mouth 4 (four) times daily as needed. ), Disp: 60 tablet, Rfl: 3 .  [name], DISSOLVE 1 TABLET BY MOUTH EVERY 8 HOURS AS NEEDED FOR NAUSEA OR VOMITING, Disp: 30 tablet, Rfl: 3 .  Pediatric Multivit-Minerals-C (KIDS GUMMY BEAR VITAMINS PO), Take by mouth., Disp: , Rfl:  .  [name], TAKE 1 TABLET BY MOUTH EVERY 6 HOURS AS NEEDED FOR NAUSEA OR VOMITING., Disp: 30 tablet, Rfl: 6 .  SUMAtriptan (IMITREX) 50 MG tablet, Take 1 tablet at onset of headache May repeat every 2 hours x 3 total doses in 24 hours if headache persists or returns., Disp: 10 tablet, Rfl: 0   Family History   Problem Relation Age of Onset  . Cancer Father        skin  . Thyroid disease Mother      Social History   Tobacco Use  . Smoking status: Never Smoker  . Smokeless tobacco: Never Used  Substance Use Topics  . Alcohol use: Yes    Comment: occasionally   . [name]    Allergies as of 02/24/2019 - Review Complete 02/24/2019  Allergen Reaction Noted  . Banana Itching 04/05/2016  . [name] Hives 08/10/2014  . Cantaloupe (diagnostic) Itching 04/05/2016  . [name] Hives 08/10/2014    Review of Systems:    All systems reviewed and negative except where noted in HPI.   Physical Exam:  BP 110/76   Pulse (!) 105   Temp 97.6 F (36.4 C) (Temporal)   Ht 5\' 5"  (1.651 m)   Wt 158 lb 6.4 oz (71.8 kg)   BMI 26.36 kg/m  No LMP recorded. (Menstrual status: Oral contraceptives).  General:   Alert,  Well-developed, well-nourished, pleasant and cooperative in NAD Head:  Normocephalic and atraumatic. Eyes:  Sclera clear, no icterus.   Conjunctiva pink. Ears:  Normal auditory  acuity. Nose:  No deformity, discharge, or lesions. Mouth:  No deformity or lesions,oropharynx pink & moist. Neck:  Supple; no masses or thyromegaly. Lungs:  Respirations even and unlabored.  Clear throughout to auscultation.   No wheezes, crackles, or rhonchi. No acute distress. Heart:  Regular rate and rhythm; no murmurs, clicks, rubs, or gallops. Abdomen:  Normal bowel sounds. Soft, mild, diffuse generalized abdominal tenderness and non-distended without masses, hepatosplenomegaly or hernias noted.  No guarding or rebound tenderness.   Rectal: Severe tenderness in the posterior wall of the anal canal on digital rectal exam, normal perianal skin Msk:  Symmetrical without gross deformities. Good, equal movement & strength bilaterally. Pulses:  Normal pulses noted. Extremities:  No clubbing or edema.  No cyanosis. Neurologic:  Alert and oriented x3;  grossly normal neurologically.  Skin:  Intact without significant lesions or rashes. No jaundice. Lymph Nodes:  No significant cervical adenopathy. Psych:  Alert and cooperative. Normal mood and affect.  Imaging Studies: Reviewed  Assessment and Plan:   Carol Conley is a 28 y.o. female with IBS diarrhea, dyspepsia, mild gastroparesis.  Patient acknowledges consumption of carbonated beverages and fruit juices daily.  This is probably contributing to her ongoing GI symptoms.  I strongly recommended her to completely eliminate all the soft drinks, fruit juices as well as artificial sweeteners.  We will check H. pylori IgG and H. pylori breath test.  Recheck total IgA levels, TTG IgA.  Check CRP, fecal calprotectin levels.  Recheck iron panel, B12 and folate levels. Discontinue Reglan.  Okay to continue [name].  I would like to repeat gastric emptying study once she is off Reglan and after above work-up.  Today, I set her expectations about functional GI disorder management and discussed various options including role of short course of antibiotics, dietary modification, psychotropic medication  Anal fissure Recommend topical 0.125% nitroglycerin with 5% lidocaine, apply 3-4 times a day  Follow up in 4 to 6 weeks   Arlyss Repressohini R Vanga, MD

## 2019-02-25 ENCOUNTER — Encounter: Payer: Self-pay | Admitting: Gastroenterology

## 2019-02-25 DIAGNOSIS — K30 Functional dyspepsia: Secondary | ICD-10-CM | POA: Insufficient documentation

## 2019-02-25 DIAGNOSIS — K602 Anal fissure, unspecified: Secondary | ICD-10-CM | POA: Insufficient documentation

## 2019-02-25 DIAGNOSIS — R1110 Vomiting, unspecified: Secondary | ICD-10-CM | POA: Insufficient documentation

## 2019-02-25 LAB — TISSUE TRANSGLUTAMINASE, IGA: Tissue Transglutaminase Ab, IgA: <2 U/mL (ref 0–3)

## 2019-02-25 LAB — H. PYLORI ANTIBODY, IGG: H Pylori IgG: 0.34 Index Value (ref 0.00–0.79)

## 2019-02-27 LAB — IGA: IgA: 72 mg/dL — ABNORMAL LOW (ref 87–352)

## 2019-03-01 ENCOUNTER — Ambulatory Visit (INDEPENDENT_AMBULATORY_CARE_PROVIDER_SITE_OTHER): Payer: No Typology Code available for payment source | Admitting: Psychology

## 2019-03-01 ENCOUNTER — Telehealth: Payer: Self-pay

## 2019-03-01 DIAGNOSIS — F411 Generalized anxiety disorder: Secondary | ICD-10-CM | POA: Diagnosis not present

## 2019-03-01 NOTE — Telephone Encounter (Signed)
My chart message sent to pt.

## 2019-03-01 NOTE — Telephone Encounter (Signed)
-----   Message from Lin Landsman, MD sent at 03/01/2019  3:15 PM EST ----- Please call patient and find out if she is willing to try fusion plus  Thanks RV

## 2019-03-08 ENCOUNTER — Other Ambulatory Visit
Admission: RE | Admit: 2019-03-08 | Discharge: 2019-03-08 | Disposition: A | Discharge status: Home / Self Care | Payer: No Typology Code available for payment source | Source: Ambulatory Visit | Attending: Gastroenterology | Admitting: Gastroenterology

## 2019-03-08 DIAGNOSIS — R1013 Epigastric pain: Secondary | ICD-10-CM | POA: Diagnosis present

## 2019-03-10 LAB — CALPROTECTIN, FECAL: Calprotectin, Fecal: 53 ug/g (ref 0–120)

## 2019-03-16 ENCOUNTER — Other Ambulatory Visit: Payer: Self-pay | Admitting: Gastroenterology

## 2019-03-29 ENCOUNTER — Ambulatory Visit (INDEPENDENT_AMBULATORY_CARE_PROVIDER_SITE_OTHER): Payer: No Typology Code available for payment source | Admitting: Psychology

## 2019-03-29 DIAGNOSIS — F411 Generalized anxiety disorder: Secondary | ICD-10-CM

## 2019-03-30 NOTE — Telephone Encounter (Signed)
Ok to refill 

## 2019-04-08 ENCOUNTER — Ambulatory Visit: Payer: No Typology Code available for payment source | Admitting: Gastroenterology

## 2019-04-21 ENCOUNTER — Ambulatory Visit (INDEPENDENT_AMBULATORY_CARE_PROVIDER_SITE_OTHER): Payer: No Typology Code available for payment source | Admitting: Psychology

## 2019-04-21 DIAGNOSIS — F411 Generalized anxiety disorder: Secondary | ICD-10-CM | POA: Diagnosis not present

## 2019-05-04 ENCOUNTER — Ambulatory Visit: Payer: No Typology Code available for payment source | Admitting: Psychology

## 2019-05-05 ENCOUNTER — Other Ambulatory Visit: Payer: Self-pay

## 2019-05-05 ENCOUNTER — Ambulatory Visit: Payer: No Typology Code available for payment source | Admitting: Gastroenterology

## 2019-05-06 ENCOUNTER — Ambulatory Visit: Payer: No Typology Code available for payment source | Admitting: Psychology

## 2019-05-07 ENCOUNTER — Encounter: Payer: Self-pay | Admitting: Gastroenterology

## 2019-05-07 ENCOUNTER — Ambulatory Visit (INDEPENDENT_AMBULATORY_CARE_PROVIDER_SITE_OTHER): Payer: No Typology Code available for payment source | Admitting: Gastroenterology

## 2019-05-07 ENCOUNTER — Other Ambulatory Visit: Payer: Self-pay

## 2019-05-07 DIAGNOSIS — R1013 Epigastric pain: Secondary | ICD-10-CM | POA: Diagnosis not present

## 2019-05-07 DIAGNOSIS — K30 Functional dyspepsia: Secondary | ICD-10-CM | POA: Diagnosis not present

## 2019-05-07 DIAGNOSIS — K529 Noninfective gastroenteritis and colitis, unspecified: Secondary | ICD-10-CM

## 2019-05-07 DIAGNOSIS — D802 Selective deficiency of immunoglobulin A [IgA]: Secondary | ICD-10-CM

## 2019-05-07 NOTE — Addendum Note (Signed)
Addended by: Radene Knee L on: 05/07/2019 12:11 PM   Modules accepted: Orders

## 2019-05-07 NOTE — Patient Instructions (Addendum)
Your gastric emptying study is scheduled on  05/26/2019. [facility]30 for 8:00 appointment  Please hold [name] and Bentyl for 8 hours before procedure. No pain medication. Nothing to eat or drank 6 hours before procedure

## 2019-05-07 NOTE — Progress Notes (Signed)
Carol Sear, MD [address]  [place], [place] [address]  Main: [phone]  Fax: [phone]    Gastroenterology Consultation Video Visit  Referring Provider:     No ref. provider found Primary Care Physician:  Mikey College, NP (Inactive) Primary Gastroenterologist:  Dr. Cephas Darby Reason for Consultation: Dyspepsia, chronic diarrhea        HPI:   Carol Conley is a 29 y.o. female referred by Dr. Merrilyn Puma, Jerrel Ivory, NP (Inactive)  for consultation & management of dyspepsia, chronic diarrhea  Virtual Visit Video Note  I connected with Carol Conley on 05/07/19 at 10:15 AM EST by video and verified that I am speaking with the correct person using two identifiers.   I discussed the limitations, risks, security and privacy concerns of performing an evaluation and management service by video and the availability of in person appointments. I also discussed with the patient that there may be a patient responsible charge related to this service. The patient expressed understanding and agreed to proceed.  Location of the Patient: Home  Location of the provider: Home office  Persons participating in the visit: Patient and provider only   History of Present Illness: Carol Conley is a 29 y.o. female with IBS diarrhea, dyspepsia, mild gastroparesis.  Since last visit, she underwent further evaluation including H. pylori IgG which is negative, normal fecal calprotectin levels, severe iron deficiency, normal CRP, has selective IgA deficiency.  Patient started taking oral iron.  She continues to have several bouts of diarrhea.  She stopped taking Reglan.  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  NSAIDs: None  Antiplts/Anticoagulants/Anti thrombotics: None  GI Procedures:  EGD 12/2016 - Small hiatal hernia. - Benign-appearing esophageal stenosis. Dilated. - Food (residue) in the stomach. - Normal  examined duodenum. - Biopsy performed in the middle third of the esophagus.    EGD 03/2016 - Benign-appearing esophageal stenosis. Dilated. - Small hiatal hernia. - Normal stomach. - Normal examined duodenum. Biopsied.  DIAGNOSIS:  A. SMALL BOWEL; BIOPSY:  - SMALL BOWEL MUCOSA WITH INTACT VILLOUS ARCHITECTURE.  - NEGATIVE FOR INTRAEPITHELIAL LYMPHOCYTOSIS, DYSPLASIA AND MALIGNANCY.   B. MID ESOPHAGUS; BIOPSY:  - NO PATHOLOGIC CHANGE.  Past Medical History:  Diagnosis Date  . Allergy   . Asthma    as child  . Colitis   . Esophageal dysphagia   . GERD (gastroesophageal reflux disease)   . Headache    sinus  . IBS (irritable bowel syndrome)   . IgA deficiency, selective ([place])   . Motion sickness    cars, planes  . Sinusitis   . Stricture and stenosis of esophagus     Past Surgical History:  Procedure Laterality Date  . ESOPHAGEAL DILATION  04/08/2016   Procedure: ESOPHAGEAL DILATION;  Surgeon: Lucilla Lame, MD;  Location: [place];  Service: Endoscopy;;  . ESOPHAGEAL DILATION  12/26/2016   Procedure: ESOPHAGEAL DILATION;  Surgeon: Lucilla Lame, MD;  Location: [place];  Service: Endoscopy;;  . ESOPHAGOGASTRODUODENOSCOPY (EGD) WITH PROPOFOL N/A 04/08/2016   Procedure: ESOPHAGOGASTRODUODENOSCOPY (EGD) WITH PROPOFOL;  Surgeon: Lucilla Lame, MD;  Location: [place];  Service: Endoscopy;  Laterality: N/A;  . ESOPHAGOGASTRODUODENOSCOPY (EGD) WITH PROPOFOL N/A 12/26/2016   Procedure: ESOPHAGOGASTRODUODENOSCOPY (EGD) WITH PROPOFOL;  Surgeon: Lucilla Lame, MD;  Location: [facility];  Service: Endoscopy;  Laterality: N/A;  UPREG  . OVARIAN CYST REMOVAL Right    2007  . [facility]  Current Outpatient Medications:  .  [name];[name], Inhale 2 puffs into the lungs every 6 (six) hours as needed for wheezing or shortness of breath., Disp: 1 Inhaler, Rfl: 0 .   aspirin-acetaminophen-caffeine (EXCEDRIN MIGRAINE) 250-250-65 MG per tablet, Take 2 tablets by mouth every 8 (eight) hours as needed for migraine. , Disp: , Rfl:  .  dexlansoprazole ([name]) 60 MG capsule, Take 1 capsule (60 mg total) by mouth daily., Disp: 30 capsule, Rfl: 5 .  [name], TAKE 1 CAPSULE BY MOUTH 4 TIMES DAILY - BEFORE MEALS AND AT BEDTIME., Disp: 90 capsule, Rfl: 3 .  fluticasone (FLONASE) 50 MCG/ACT nasal spray, Place 2 sprays into both nostrils daily., Disp: 16 g, Rfl: 2 .  [name] 1-20 MG-MCG tablet, TAKE 1 TABLET BY MOUTH DAILY CONTINUOUSLY AS DIRECTED, Disp: 3 Package, Rfl: 4 .  levocetirizine (XYZAL) 5 MG tablet, TAKE 1 TABLET BY MOUTH EVERY EVENING, Disp: 30 tablet, Rfl: 5 .  Lidocaine HCl Monohydrate POWD, , Disp: , Rfl:  .  Lidocaine, [name], [name], Relief of anorectal pain and itching: Apply small, pea-sized amount of cream to affected area up to 6 times daily as needed, Disp: 30 g, Rfl: 1 .  Melatonin 1 MG TABS, Take 1-2 tablets (1-2 mg total) by mouth at bedtime., Disp: , Rfl:  .  [name], DISSOLVE 1 TABLET BY MOUTH EVERY 8 HOURS AS NEEDED FOR NAUSEA OR VOMITING, Disp: 30 tablet, Rfl: 3 .  Pediatric Multivit-Minerals-C (KIDS GUMMY BEAR VITAMINS PO), Take by mouth., Disp: , Rfl:  .  [name], TAKE 1 TABLET BY MOUTH EVERY 6 HOURS AS NEEDED FOR NAUSEA OR VOMITING., Disp: 30 tablet, Rfl: 6 .  SUMAtriptan (IMITREX) 50 MG tablet, Take 1 tablet at onset of headache May repeat every 2 hours x 3 total doses in 24 hours if headache persists or returns., Disp: 10 tablet, Rfl: 0   Family History  Problem Relation Age of Onset  . Cancer Father        skin  . Thyroid disease Mother      Social History   Tobacco Use  . Smoking status: Never Smoker  . Smokeless tobacco: Never Used  Substance Use Topics  . Alcohol use: Yes    Comment: occasionally   . [name]     Allergies as of 05/07/2019 - Review Complete 05/07/2019  Allergen Reaction Noted  . Banana Itching 04/05/2016  . [name] Hives 08/10/2014  . Cantaloupe (diagnostic) Itching 04/05/2016  . [name] Hives 08/10/2014     Imaging Studies: Reviewed  Assessment and Plan:   Carol Conley is a 29 y.o. female with IBS diarrhea, dyspepsia, mild gastroparesis.  Work-up thus far including upper endoscopy with gastric and duodenal biopsies were negative in the past, H. pylori IgG negative, normal fecal calprotectin levels, normal CRP.  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  She does have chronic iron deficiency without evidence of anemia in the absence of menorrhagia.  Patient is currently off Reglan.  With ongoing symptoms of dyspepsia and diarrhea and selective IgA deficiency and iron deficiency, recommend to check DGP IgG levels, HLA DQ 2/DQ 8, gastric emptying study and stool studies to evaluate for infection  Continue topical nitroglycerin for anal fissure   Follow Up Instructions:   I discussed the assessment and treatment  plan with the patient. The patient was provided an opportunity to ask questions and all were answered. The patient agreed with the plan and demonstrated an understanding of the instructions.   The patient was advised to call back or seek an in-person evaluation if the symptoms worsen or if the condition fails to improve as anticipated.  I provided 15 minutes of face-to-face time during this encounter.   Follow up in 2 months   Cephas Darby, MD

## 2019-05-19 ENCOUNTER — Ambulatory Visit (INDEPENDENT_AMBULATORY_CARE_PROVIDER_SITE_OTHER): Payer: No Typology Code available for payment source | Admitting: Psychology

## 2019-05-19 DIAGNOSIS — F411 Generalized anxiety disorder: Secondary | ICD-10-CM

## 2019-05-20 ENCOUNTER — Other Ambulatory Visit: Payer: Self-pay

## 2019-05-20 ENCOUNTER — Other Ambulatory Visit
Admission: RE | Admit: 2019-05-20 | Discharge: 2019-05-20 | Disposition: A | Discharge status: Home / Self Care | Payer: No Typology Code available for payment source | Source: Ambulatory Visit | Attending: Gastroenterology | Admitting: Gastroenterology

## 2019-05-20 DIAGNOSIS — K529 Noninfective gastroenteritis and colitis, unspecified: Secondary | ICD-10-CM | POA: Insufficient documentation

## 2019-05-20 DIAGNOSIS — K30 Functional dyspepsia: Secondary | ICD-10-CM | POA: Diagnosis present

## 2019-05-20 DIAGNOSIS — D802 Selective deficiency of immunoglobulin A [IgA]: Secondary | ICD-10-CM | POA: Insufficient documentation

## 2019-05-20 LAB — HEMOGLOBIN A1C
Hgb A1c MFr Bld: 5 % (ref 4.8–5.6)
Mean Plasma Glucose: 96.8 mg/dL

## 2019-05-21 ENCOUNTER — Telehealth: Payer: Self-pay | Admitting: Gastroenterology

## 2019-05-21 LAB — MISC LABCORP TEST (SEND OUT): Labcorp test code: 164040

## 2019-05-21 NOTE — Telephone Encounter (Signed)
Carol Conley with [facility] called & l/m on v/m stating patient needs precert for her Nuc Med test on 05-26-2019.

## 2019-05-24 NOTE — Telephone Encounter (Signed)
Informed pre service that clinicals have been faxed to the nurse reviewer. I have also spoken with the pt to advise her of this. She stated she may need to reschedule due to her work schedule.

## 2019-05-26 ENCOUNTER — Ambulatory Visit: Payer: No Typology Code available for payment source

## 2019-05-27 LAB — MISC LABCORP TEST (SEND OUT): Labcorp test code: 167082

## 2019-06-08 ENCOUNTER — Other Ambulatory Visit: Payer: Self-pay | Admitting: Gastroenterology

## 2019-06-08 NOTE — Telephone Encounter (Signed)
Ok to refill 

## 2019-06-08 NOTE — Telephone Encounter (Signed)
Pt is calling needing a refill on  rx [name]  60mg  send to [facility] outpatient pharmacy

## 2019-06-14 ENCOUNTER — Ambulatory Visit: Payer: No Typology Code available for payment source | Admitting: Psychology

## 2019-06-21 ENCOUNTER — Ambulatory Visit (INDEPENDENT_AMBULATORY_CARE_PROVIDER_SITE_OTHER): Payer: No Typology Code available for payment source | Admitting: Psychology

## 2019-06-21 DIAGNOSIS — F411 Generalized anxiety disorder: Secondary | ICD-10-CM

## 2019-07-16 ENCOUNTER — Ambulatory Visit (INDEPENDENT_AMBULATORY_CARE_PROVIDER_SITE_OTHER): Payer: No Typology Code available for payment source | Admitting: Psychology

## 2019-07-16 DIAGNOSIS — F411 Generalized anxiety disorder: Secondary | ICD-10-CM | POA: Diagnosis not present

## 2019-07-22 ENCOUNTER — Encounter: Payer: Self-pay | Admitting: Gastroenterology

## 2019-07-22 NOTE — Telephone Encounter (Signed)
Called patient back and she states she has the focus plan with [facility] and she has to get everything approved before she can have it done. Patient wants [place] to get her gastric emptying study approved before we can and scheduled the procedure so she knows it is approved. She states her lab work she is trying to get approved before she has it done. She states they are trying to say another provider order the labs. Informed patient that Dr. Allegra Lai order them and no other doctor. Can you check for approval for gastric emptying

## 2019-08-05 ENCOUNTER — Other Ambulatory Visit: Payer: Self-pay

## 2019-08-05 ENCOUNTER — Emergency Department
Admission: EM | Admit: 2019-08-05 | Discharge: 2019-08-05 | Disposition: A | Discharge status: Home / Self Care | Payer: No Typology Code available for payment source | Attending: Emergency Medicine | Admitting: Emergency Medicine

## 2019-08-05 ENCOUNTER — Emergency Department: Payer: No Typology Code available for payment source

## 2019-08-05 DIAGNOSIS — R519 Headache, unspecified: Secondary | ICD-10-CM | POA: Diagnosis not present

## 2019-08-05 DIAGNOSIS — Z20822 Contact with and (suspected) exposure to covid-19: Secondary | ICD-10-CM | POA: Insufficient documentation

## 2019-08-05 DIAGNOSIS — Z79899 Other long term (current) drug therapy: Secondary | ICD-10-CM | POA: Insufficient documentation

## 2019-08-05 DIAGNOSIS — N390 Urinary tract infection, site not specified: Secondary | ICD-10-CM | POA: Insufficient documentation

## 2019-08-05 LAB — URINALYSIS, COMPLETE (UACMP) WITH MICROSCOPIC
Bilirubin Urine: NEGATIVE
Glucose, UA: NEGATIVE mg/dL
Hgb urine dipstick: NEGATIVE
Ketones, ur: NEGATIVE mg/dL
Leukocytes,Ua: NEGATIVE
Nitrite: NEGATIVE
Protein, ur: NEGATIVE mg/dL
Specific Gravity, Urine: 1.018 (ref 1.005–1.030)
pH: 5 (ref 5.0–8.0)

## 2019-08-05 LAB — BASIC METABOLIC PANEL
Anion gap: 9 (ref 5–15)
BUN: 13 mg/dL (ref 6–20)
CO2: 23 mmol/L (ref 22–32)
Calcium: 8.8 mg/dL — ABNORMAL LOW (ref 8.9–10.3)
Chloride: 109 mmol/L (ref 98–111)
Creatinine, Ser: 0.82 mg/dL (ref 0.44–1.00)
GFR calc Af Amer: >60 mL/min (ref >60)
GFR calc non Af Amer: >60 mL/min (ref >60)
Glucose, Bld: 104 mg/dL — ABNORMAL HIGH (ref 70–99)
Potassium: 3.5 mmol/L (ref 3.5–5.1)
Sodium: 141 mmol/L (ref 135–145)

## 2019-08-05 LAB — CBC
HCT: 36.2 % (ref 36.0–46.0)
Hemoglobin: 12.6 g/dL (ref 12.0–15.0)
MCH: 27.5 pg (ref 26.0–34.0)
MCHC: 34.8 g/dL (ref 30.0–36.0)
MCV: 78.9 fL — ABNORMAL LOW (ref 80.0–100.0)
Platelets: 278 10*3/uL (ref 150–400)
RBC: 4.59 MIL/uL (ref 3.87–5.11)
RDW: 12.9 % (ref 11.5–15.5)
WBC: 8.3 10*3/uL (ref 4.0–10.5)
nRBC: 0 % (ref 0.0–0.2)

## 2019-08-05 LAB — POC SARS CORONAVIRUS 2 AG: SARS Coronavirus 2 Ag: NEGATIVE

## 2019-08-05 LAB — POC URINE PREG, ED: Preg Test, Ur: NEGATIVE

## 2019-08-05 MED ORDER — DIPHENHYDRAMINE HCL 50 MG/ML IJ SOLN
50.0000 mg | Freq: Once | INTRAMUSCULAR | Status: AC
  Administered 2019-08-05: 50 mg via INTRAVENOUS
  Filled 2019-08-05: qty 1

## 2019-08-05 MED ORDER — FOSFOMYCIN TROMETHAMINE 3 G PO PACK
3.0000 g | PACK | Freq: Once | ORAL | Status: AC
  Administered 2019-08-05: 3 g via ORAL
  Filled 2019-08-05: qty 3

## 2019-08-05 MED ORDER — SODIUM CHLORIDE 0.9 % IV BOLUS
1000.0000 mL | Freq: Once | INTRAVENOUS | Status: AC
  Administered 2019-08-05: 1000 mL via INTRAVENOUS

## 2019-08-05 MED ORDER — ACETAMINOPHEN 325 MG PO TABS
650.0000 mg | ORAL_TABLET | Freq: Once | ORAL | Status: AC
  Administered 2019-08-05: 650 mg via ORAL

## 2019-08-05 MED ORDER — METOCLOPRAMIDE HCL 5 MG/ML IJ SOLN
10.0000 mg | Freq: Once | INTRAMUSCULAR | Status: AC
  Administered 2019-08-05: 10 mg via INTRAVENOUS
  Filled 2019-08-05: qty 2

## 2019-08-05 MED ORDER — ACETAMINOPHEN 325 MG PO TABS
ORAL_TABLET | ORAL | Status: AC
  Filled 2019-08-05: qty 2

## 2019-08-05 MED ORDER — KETOROLAC TROMETHAMINE 30 MG/ML IJ SOLN
30.0000 mg | Freq: Once | INTRAMUSCULAR | Status: AC
  Administered 2019-08-05: 30 mg via INTRAVENOUS
  Filled 2019-08-05: qty 1

## 2019-08-05 NOTE — ED Triage Notes (Signed)
Pt to the er for headache, dizziness and fall. PT states she has a hx of vertigo but this feels different. Pt reports pain to the left side of the head. Pt became dizzy at home and fell landing on her buttocks. No LOC and pt remembers entire event.

## 2019-08-05 NOTE — ED Notes (Signed)
Pt transported to CT ?

## 2019-08-05 NOTE — Progress Notes (Signed)
Patient adequate for discharge. VSS; patient A&Ox4. Patient states she feels alert to drive home after receiving benadryl.

## 2019-08-05 NOTE — ED Provider Notes (Signed)
[facility] Emergency Department Provider Note  Time seen: 3:34 AM  I have reviewed the triage vital signs and the nursing notes.   HISTORY  Chief Complaint Headache   HPI Carol Conley is a 29 y.o. female with a past medical history of gastric reflux, IBS, vertigo, migraines, presents to the emergency department for headache.  According to the patient the past 24 hours she has been experiencing headache mostly on the left side.  Patient states a history of migraines but states this feels somewhat different as her migraines are usually more central in her forehead and this 1 is more left-sided.  States she is also been feeling dizzy to the point where she lost her balance today and fell on her buttocks.  Patient states a history of vertigo but states this dizziness is a little bit different where she describes it more as feeling off balance than a spinning sensation.  Patient denies any fever.  Denies any cough or shortness of breath.  Has completed her Covid vaccinations.  Overall patient appears well.  Largely negative review of systems otherwise.   Past Medical History:  Diagnosis Date  . Allergy   . Asthma    as child  . Colitis   . Esophageal dysphagia   . GERD (gastroesophageal reflux disease)   . Headache    sinus  . IBS (irritable bowel syndrome)   . IgA deficiency, selective (HCC)   . Motion sickness    cars, planes  . Sinusitis   . Stricture and stenosis of esophagus     Patient Active Problem List   Diagnosis Date Noted  . Non-ulcer dyspepsia 02/25/2019  . Regurgitation of food 02/25/2019  . Anal fissure 02/25/2019  . 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 Asthma, mild intermittent 08/31/2015  . 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 Hemorrhoids, internal 10/20/2014  . Hemorrhoid 10/20/2014  . IgA deficiency, isolated (HCC) 10/20/2014    Past Surgical History:  Procedure Laterality Date  . ESOPHAGEAL DILATION   04/08/2016   Procedure: ESOPHAGEAL DILATION;  Surgeon: [name], MD;  Location: [facility];  Service: Endoscopy;;  . ESOPHAGEAL DILATION  12/26/2016   Procedure: ESOPHAGEAL DILATION;  Surgeon: [name], MD;  Location: [facility];  Service: Endoscopy;;  . ESOPHAGOGASTRODUODENOSCOPY (EGD) WITH PROPOFOL N/A 04/08/2016   Procedure: ESOPHAGOGASTRODUODENOSCOPY (EGD) WITH PROPOFOL;  Surgeon: [name], MD;  Location: [facility];  Service: Endoscopy;  Laterality: N/A;  . ESOPHAGOGASTRODUODENOSCOPY (EGD) WITH PROPOFOL N/A 12/26/2016   Procedure: ESOPHAGOGASTRODUODENOSCOPY (EGD) WITH PROPOFOL;  Surgeon: [name], MD;  Location: [facility];  Service: Endoscopy;  Laterality: N/A;  UPREG  . OVARIAN CYST REMOVAL Right    2007  . [facility]      [name]   Medication Sig Start Date End Date Taking? Authorizing Provider  [name];[name] Inhale 2 puffs into the lungs every 6 (six) hours as needed for wheezing or shortness of breath. 06/12/17   Zachery Dauer, NP  aspirin-acetaminophen-caffeine (EXCEDRIN MIGRAINE) [phone] MG per tablet Take 2 tablets by mouth every 8 (eight) hours as needed for migraine.     [provider]  [name] 60 MG capsule TAKE 1 CAPSULE BY MOUTH DAILY 06/08/19   Vanga, Loel Dubonnet, MD  [name] TAKE 1 CAPSULE BY MOUTH 4 TIMES DAILY - BEFORE MEALS AND AT BEDTIME. 03/30/19   Vanga, Loel Dubonnet, MD  fluticasone (  FLONASE) 50 MCG/ACT nasal spray Place 2 sprays into both nostrils daily. 11/26/17   Galen Manila, NP  [name] 1-20 MG-MCG tablet TAKE 1 TABLET BY MOUTH DAILY CONTINUOUSLY AS DIRECTED 09/24/18   Shambley, Melody N, CNM  levocetirizine (XYZAL) 5 MG tablet TAKE 1 TABLET BY MOUTH EVERY EVENING 12/01/18   Galen Manila, NP  Lidocaine HCl Monohydrate POWD  02/24/19   [provider]  Lidocaine,  [name], [name] Relief of anorectal pain and itching: Apply small, pea-sized amount of cream to affected area up to 6 times daily as needed 03/07/15   Danelle Berry, PA-C  Melatonin 1 MG TABS Take 1-2 tablets (1-2 mg total) by mouth at bedtime. 10/06/17   Galen Manila, NP  [name] DISSOLVE 1 TABLET BY MOUTH EVERY 8 HOURS AS NEEDED FOR NAUSEA OR VOMITING 02/03/19   [name], MD  Pediatric Multivit-Minerals-C (KIDS GUMMY BEAR VITAMINS PO) Take by mouth.    [provider]  [name] TAKE 1 TABLET BY MOUTH EVERY 6 HOURS AS NEEDED FOR NAUSEA OR VOMITING. 02/03/19   [name], MD  SUMAtriptan (IMITREX) 50 MG tablet Take 1 tablet at onset of headache May repeat every 2 hours x 3 total doses in 24 hours if headache persists or returns. 04/10/17   Galen Manila, NP    Allergies  Allergen Reactions  . Banana Itching    Itchy Throat  . [name] Hives  . Cantaloupe (Diagnostic) Itching    Itchy throat  . [name] Hives    Has taken other cephalosporins and augmentin without difficulty    Family History  Problem Relation Age of Onset  . Cancer Father        skin  . Thyroid disease Mother     Social History Social History   Tobacco Use  . Smoking status: Never Smoker  . Smokeless tobacco: Never Used  Substance Use Topics  . Alcohol use: Yes    Comment: occasionally   . [name]    Review of Systems Constitutional: Negative for fever. Cardiovascular: Negative for chest pain. Respiratory: Negative for shortness of breath. Gastrointestinal: Negative for abdominal pain.   Genitourinary: Negative for urinary compaints Neurological: Negative for headache All other ROS negative  ____________________________________________   PHYSICAL EXAM:  VITAL SIGNS: ED Triage Vitals [08/05/19 0147]  Enc Vitals Group     BP 128/82     Pulse Rate 88     Resp 18      Temp 98.6 F (37 C)     Temp Source Oral     SpO2 97 %     Weight 160 lb (72.6 kg)     Height 5\' 5"  (1.651 m)     Head Circumference      Peak Flow      Pain Score 5     Pain Loc      Pain Edu?      Excl. in GC?    Constitutional: Alert and oriented. Well appearing and in no distress. Eyes: Normal exam.  No photophobia. ENT      Head: Normocephalic and atraumatic.      Mouth/Throat: Mucous membranes are moist. Cardiovascular: Normal rate, regular rhythm.  Respiratory: Normal respiratory effort without tachypnea nor retractions. Breath sounds are clear Gastrointestinal: Soft and nontender. No distention.   Musculoskeletal: Nontender with normal range of motion in all extremities. Neurologic:  Normal speech and language.  No gross focal neurologic deficits.  Equal grip strengths.  No pronator drift.  Cranial nerves intact. Skin:  Skin is warm, dry and intact.  Psychiatric: Mood and affect are normal.   ____________________________________________    EKG  EKG viewed and interpreted by myself shows a normal sinus rhythm at 90 bpm with a narrow QRS, normal axis, normal intervals, no concerning ST changes.  ____________________________________________   INITIAL IMPRESSION / ASSESSMENT AND PLAN / ED COURSE  Pertinent labs & imaging results that were available during my care of the patient were reviewed by me and considered in my medical decision making (see chart for details).   Patient presents emergency department for a headache and dizziness.  Overall the patient appears well, reassuring physical neurological examination.  However given the patient's complaint of headache that differs from her typical migraines I offered CT imaging, patient wishes to proceed with CT imaging.  Will obtain a CT scan to rule out intracranial abnormality.  We will treat with Toradol Reglan Benadryl and IV fluids and continue to closely monitor.  Patient agreeable to plan of care.  Patient CT  scan is negative.  Patient's labs are largely within normal limits, Covid negative.  Urinalysis however does show bacteria.  We will treat with a one-time dose of fosfomycin.  Patient agreeable to plan of care.  I have sent a urine culture as well.  Patient states her headache is nearly gone at this time.  We will discharge home.    Carol Conley was evaluated in Emergency Department on 08/05/2019 for the symptoms described in the history of present illness. She was evaluated in the context of the global COVID-19 pandemic, which necessitated consideration that the patient might be at risk for infection with the SARS-CoV-2 virus that causes COVID-19. Institutional protocols and algorithms that pertain to the evaluation of patients at risk for COVID-19 are in a state of rapid change based on information released by regulatory bodies including the CDC and federal and state organizations. These policies and algorithms were followed during the patient's care in the ED.  ____________________________________________   FINAL CLINICAL IMPRESSION(S) / ED DIAGNOSES  Headache UTI   Harvest Dark, MD 08/05/19 0530

## 2019-08-05 NOTE — Progress Notes (Signed)
Per report; patient to be discharged upon completion of IVF.

## 2019-08-05 NOTE — ED Notes (Signed)
Pt pending discharge waiting for more IV fluids to finish.

## 2019-08-05 NOTE — ED Notes (Signed)
Pt ambulatory to bathroom and reports dizziness has subsided. Pt placed back on IV fluids and resting in bed at this time. Lights remain dimmed and call bell in reach.

## 2019-08-05 NOTE — ED Notes (Signed)
Discharge papers reviewed with patient and no further questions at this time.

## 2019-08-05 NOTE — ED Notes (Signed)
Pt reports feeling as though she is "on a roller coaster and riding in a car at the same time." Pt reports she is unable to lay flat because the dizziness will worsen."   Pt unable to provide urine sample at this time.

## 2019-08-05 NOTE — ED Notes (Signed)
MD at bedside. 

## 2019-08-06 ENCOUNTER — Ambulatory Visit
Admission: EM | Admit: 2019-08-06 | Discharge: 2019-08-06 | Disposition: A | Discharge status: Home / Self Care | Payer: No Typology Code available for payment source

## 2019-08-06 DIAGNOSIS — R42 Dizziness and giddiness: Secondary | ICD-10-CM | POA: Diagnosis not present

## 2019-08-06 DIAGNOSIS — R519 Headache, unspecified: Secondary | ICD-10-CM

## 2019-08-06 LAB — URINE CULTURE: Culture: <10000 — AB

## 2019-08-06 MED ORDER — MECLIZINE HCL 12.5 MG PO TABS
12.5000 mg | ORAL_TABLET | Freq: Three times a day (TID) | ORAL | 0 refills | Status: DC | PRN
Start: 2019-08-06 — End: 2021-02-13

## 2019-08-06 NOTE — ED Provider Notes (Signed)
Renaldo Fiddler    CSN: [id number] Arrival date & time: 08/06/19  5573      History   Chief Complaint Chief Complaint  Patient presents with  . Headache  . Dizziness    HPI Carol Conley is a 29 y.o. female.   Patient presents with ongoing headache and dizziness x4 days.  Also reports fullness in her right ear and mild sinus congestion.  She denies focal weakness, facial droop, speech difficulty, numbness, chest pain, fever, chills, sore throat, cough, shortness of breath, vomiting, rash, or other symptoms.  She has taken Excedrin and Zofran for her symptoms.  Patient was seen in the ED yesterday; diagnosed with non-intractable headache and UTI; treated with Toradol, Reglan, Benadryl, IV fluids; Head CT negative; UTI treated with fosfomycin.  Patient's medical history includes IBS, gastric reflux, vertigo, migraines, asthma, sleep disorder.  The history is provided by the patient.    Past Medical History:  Diagnosis Date  . Allergy   . Asthma    as child  . Colitis   . Esophageal dysphagia   . GERD (gastroesophageal reflux disease)   . Headache    sinus  . IBS (irritable bowel syndrome)   . IgA deficiency, selective (HCC)   . Motion sickness    cars, planes  . Sinusitis   . Stricture and stenosis of esophagus     Patient Active Problem List   Diagnosis Date Noted  . Non-ulcer dyspepsia 02/25/2019  . Regurgitation of food 02/25/2019  . Anal fissure 02/25/2019  . 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 Asthma, mild intermittent 08/31/2015  . 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 Hemorrhoids, internal 10/20/2014  . Hemorrhoid 10/20/2014  . IgA deficiency, isolated (HCC) 10/20/2014    Past Surgical History:  Procedure Laterality Date  . ESOPHAGEAL DILATION  04/08/2016   Procedure: ESOPHAGEAL DILATION;  Surgeon: [name], MD;  Location: [facility];  Service: Endoscopy;;  . ESOPHAGEAL DILATION  12/26/2016   Procedure:  ESOPHAGEAL DILATION;  Surgeon: [name], MD;  Location: [facility];  Service: Endoscopy;;  . ESOPHAGOGASTRODUODENOSCOPY (EGD) WITH PROPOFOL N/A 04/08/2016   Procedure: ESOPHAGOGASTRODUODENOSCOPY (EGD) WITH PROPOFOL;  Surgeon: [name], MD;  Location: [facility];  Service: Endoscopy;  Laterality: N/A;  . ESOPHAGOGASTRODUODENOSCOPY (EGD) WITH PROPOFOL N/A 12/26/2016   Procedure: ESOPHAGOGASTRODUODENOSCOPY (EGD) WITH PROPOFOL;  Surgeon: [name], MD;  Location: [facility];  Service: Endoscopy;  Laterality: N/A;  UPREG  . OVARIAN CYST REMOVAL Right    2007  . [facility]      OB History    Gravida  0   Para  0   Term  0   Preterm  0   AB  0   Living  0     SAB  0   TAB  0   Ectopic  0   Multiple  0   Live Births               Home Medications    [name]   Medication Sig Start Date End Date Taking? Authorizing Provider  ondansetron (ZOFRAN) 4 MG tablet Take 4 mg by mouth every 8 (eight) hours as needed for nausea or vomiting.   Yes [provider]  [name];[name] Inhale 2 puffs into the lungs every 6 (six) hours as needed for wheezing or shortness of breath. 06/12/17   Zachery Dauer, NP  aspirin-acetaminophen-caffeine (EXCEDRIN MIGRAINE)  250-250-65 MG per tablet Take 2 tablets by mouth every 8 (eight) hours as needed for migraine.     [provider]  [name] 60 MG capsule TAKE 1 CAPSULE BY MOUTH DAILY 06/08/19   Vanga, Loel Dubonnet, MD  [name] TAKE 1 CAPSULE BY MOUTH 4 TIMES DAILY - BEFORE MEALS AND AT BEDTIME. 03/30/19   Vanga, Loel Dubonnet, MD  fluticasone (FLONASE) 50 MCG/ACT nasal spray Place 2 sprays into both nostrils daily. 11/26/17   Galen Manila, NP  [name] 1-20 MG-MCG tablet TAKE 1 TABLET BY MOUTH DAILY CONTINUOUSLY AS DIRECTED 09/24/18   Shambley, Melody N, CNM  levocetirizine (XYZAL) 5 MG  tablet TAKE 1 TABLET BY MOUTH EVERY EVENING 12/01/18   Galen Manila, NP  Lidocaine HCl Monohydrate POWD  02/24/19   [provider]  Lidocaine, [name], [name] Relief of anorectal pain and itching: Apply small, pea-sized amount of cream to affected area up to 6 times daily as needed 03/07/15   Danelle Berry, PA-C  meclizine (ANTIVERT) 12.5 MG tablet Take 1 tablet (12.5 mg total) by mouth 3 (three) times daily as needed for dizziness. 08/06/19   Mickie Bail, NP  Melatonin 1 MG TABS Take 1-2 tablets (1-2 mg total) by mouth at bedtime. 10/06/17   Galen Manila, NP  [name] DISSOLVE 1 TABLET BY MOUTH EVERY 8 HOURS AS NEEDED FOR NAUSEA OR VOMITING 02/03/19   [name], MD  Pediatric Multivit-Minerals-C (KIDS GUMMY BEAR VITAMINS PO) Take by mouth.    [provider]  [name] TAKE 1 TABLET BY MOUTH EVERY 6 HOURS AS NEEDED FOR NAUSEA OR VOMITING. 02/03/19   [name], MD  SUMAtriptan (IMITREX) 50 MG tablet Take 1 tablet at onset of headache May repeat every 2 hours x 3 total doses in 24 hours if headache persists or returns. 04/10/17   Galen Manila, NP    Family History Family History  Problem Relation Age of Onset  . Cancer Father        skin  . Thyroid disease Mother     Social History Social History   Tobacco Use  . Smoking status: Never Smoker  . Smokeless tobacco: Never Used  Substance Use Topics  . Alcohol use: Yes    Comment: occasionally   . [name]     Allergies   Banana, [name], Cantaloupe (diagnostic), and [name]   Review of Systems Review of Systems  Constitutional: Negative for chills and fever.  HENT: Positive for congestion and ear pain. Negative for sore throat.   Eyes: Negative for pain and visual disturbance.  Respiratory: Negative for cough and shortness of breath.   Cardiovascular: Negative for chest pain  and palpitations.  Gastrointestinal: Negative for abdominal pain, nausea and vomiting.  Genitourinary: Negative for dysuria and hematuria.  Musculoskeletal: Negative for arthralgias and back pain.  Skin: Negative for color change and rash.  Neurological: Positive for dizziness and headaches. Negative for seizures, syncope, facial asymmetry, speech difficulty, weakness and numbness.  All other systems reviewed and are negative.    Physical Exam Triage Vital Signs ED Triage Vitals  Enc Vitals Group     BP 08/06/19 0857 106/71     Pulse Rate 08/06/19 0857 80     Resp 08/06/19 0857 18     Temp 08/06/19 0857 99.1 F (37.3 C)     Temp Source 08/06/19 0857 Oral  SpO2 08/06/19 0857 97 %     Weight --      Height --      Head Circumference --      Peak Flow --      Pain Score 08/06/19 0855 3     Pain Loc --      Pain Edu? --      Excl. in GC? --    No data found.  Updated Vital Signs BP 106/71 (BP Location: Right Arm)   Pulse 80   Temp 99.1 F (37.3 C) (Oral)   Resp 18   SpO2 97%   Visual Acuity Right Eye Distance:   Left Eye Distance:   Bilateral Distance:    Right Eye Near:   Left Eye Near:    Bilateral Near:     Physical Exam Vitals and nursing note reviewed.  Constitutional:      General: She is not in acute distress.    Appearance: She is well-developed. She is not ill-appearing.  HENT:     Head: Normocephalic and atraumatic.     Right Ear: Tympanic membrane normal.     Left Ear: Tympanic membrane normal.     Nose: Nose normal.     Mouth/Throat:     Mouth: Mucous membranes are moist.     Pharynx: Oropharynx is clear.  Eyes:     Conjunctiva/sclera: Conjunctivae normal.  Cardiovascular:     Rate and Rhythm: Normal rate and regular rhythm.     Heart sounds: No murmur.  Pulmonary:     Effort: Pulmonary effort is normal. No respiratory distress.     Breath sounds: Normal breath sounds. No wheezing or rhonchi.  Abdominal:     Palpations: Abdomen is  soft.     Tenderness: There is no abdominal tenderness. There is no right CVA tenderness, left CVA tenderness, guarding or rebound.  Musculoskeletal:     Cervical back: Neck supple.  Skin:    General: Skin is warm and dry.     Findings: No rash.  Neurological:     General: No focal deficit present.     Mental Status: She is alert and oriented to person, place, and time.     Cranial Nerves: No cranial nerve deficit.     Sensory: No sensory deficit.     Motor: No weakness.     Coordination: Coordination normal.     Gait: Gait normal.  Psychiatric:        Mood and Affect: Mood normal.        Behavior: Behavior normal.      UC Treatments / Results  Labs (all labs ordered are listed, but only abnormal results are displayed) Labs Reviewed - No data to display  EKG   Radiology CT Head Wo Contrast  Result Date: 08/05/2019 CLINICAL DATA:  Acute headache with normal neuro exam EXAM: CT HEAD WITHOUT CONTRAST TECHNIQUE: Contiguous axial images were obtained from the base of the skull through the vertex without intravenous contrast. COMPARISON:  None. FINDINGS: Brain: No evidence of infarction, hemorrhage, hydrocephalus, extra-axial collection or mass lesion/mass effect. The high-density at the foramen Monro is calcified and attributed to choroid. Normal brain volume Vascular: No hyperdense vessel or unexpected calcification. Skull: Normal. Negative for fracture or focal lesion. Sinuses/Orbits: Negative IMPRESSION: Negative head CT. Electronically Signed   By: Marnee Spring M.D.   On: 08/05/2019 04:51    Procedures Procedures (including critical care time)  Medications Ordered in UC Medications - No data to display  Initial Impression / Assessment and Plan / UC Course  I have reviewed the triage vital signs and the nursing notes.  Pertinent labs & imaging results that were available during my care of the patient were reviewed by me and considered in my medical decision making (see  chart for details).   Dizziness, acute non-intractable headache.  Patient is well-appearing and her exam is unremarkable.  Treating with meclizine.  Precautions for drowsiness with meclizine discussed with patient.  Instructed her to follow-up with her PCP if her symptoms or not improving.  Patient agrees to plan of care.   Final Clinical Impressions(s) / UC Diagnoses   Final diagnoses:  Dizziness  Acute nonintractable headache, unspecified headache type     Discharge Instructions     Take the meclizine as directed for dizziness; Do not drive, operate machinery, or drink alcohol with this medication as it will cause drowsiness.    Follow up with your primary care provider if your symptoms are not improving.        ED Prescriptions    Medication Sig Dispense Auth. Provider   meclizine (ANTIVERT) 12.5 MG tablet Take 1 tablet (12.5 mg total) by mouth 3 (three) times daily as needed for dizziness. 30 tablet Sharion Balloon, NP     PDMP not reviewed this encounter.   Sharion Balloon, NP 08/06/19 [phone]

## 2019-08-06 NOTE — ED Triage Notes (Signed)
Pt reports headaches and dizziness x 4 days.Pt states the dizziness is worse when she lay down on the right side. Pt fell 2 days ago on her buttocks as she felt dizzy.Pt is taking Excedrin and Zofran with somewhat relief.

## 2019-08-06 NOTE — Discharge Instructions (Addendum)
Take the meclizine as directed for dizziness; Do not drive, operate machinery, or drink alcohol with this medication as it will cause drowsiness.    Follow up with your primary care provider if your symptoms are not improving.

## 2019-08-09 ENCOUNTER — Ambulatory Visit (INDEPENDENT_AMBULATORY_CARE_PROVIDER_SITE_OTHER): Payer: No Typology Code available for payment source | Admitting: Family Medicine

## 2019-08-09 ENCOUNTER — Encounter: Payer: Self-pay | Admitting: Family Medicine

## 2019-08-09 ENCOUNTER — Other Ambulatory Visit: Payer: Self-pay

## 2019-08-09 VITALS — BP 104/61 | HR 91 | Temp 97.3°F | Wt 162.2 lb

## 2019-08-09 DIAGNOSIS — H6980 Other specified disorders of Eustachian tube, unspecified ear: Secondary | ICD-10-CM | POA: Insufficient documentation

## 2019-08-09 DIAGNOSIS — J0100 Acute maxillary sinusitis, unspecified: Secondary | ICD-10-CM | POA: Diagnosis not present

## 2019-08-09 DIAGNOSIS — J301 Allergic rhinitis due to pollen: Secondary | ICD-10-CM

## 2019-08-09 DIAGNOSIS — H6983 Other specified disorders of Eustachian tube, bilateral: Secondary | ICD-10-CM

## 2019-08-09 DIAGNOSIS — H6981 Other specified disorders of Eustachian tube, right ear: Secondary | ICD-10-CM

## 2019-08-09 DIAGNOSIS — H6990 Unspecified Eustachian tube disorder, unspecified ear: Secondary | ICD-10-CM | POA: Insufficient documentation

## 2019-08-09 MED ORDER — MUCINEX 600 MG PO TB12: 1200.0000 mg | ORAL_TABLET | Freq: Two times a day (BID) | ORAL | 0 refills | Status: AC

## 2019-08-09 MED ORDER — PREDNISONE 20 MG PO TABS: 40.0000 mg | ORAL_TABLET | Freq: Every day | ORAL | 0 refills | Status: AC

## 2019-08-09 MED ORDER — FLUTICASONE PROPIONATE 50 MCG/ACT NA SUSP: 2.0000 | Freq: Every day | NASAL | 2 refills | Status: DC

## 2019-08-09 NOTE — Progress Notes (Signed)
Subjective:    Patient ID: Carol Conley, female    DOB: 03-07-1991, 29 y.o.   MRN: [id number]  Carol Conley is a 29 y.o. female presenting on 08/09/2019 for Migraine (Pt recently seen at the emergency x 4 days ago for migraine w/ dizziness. The headache was only on the left side. They treated the migraine w/ migraine cocktail and the headache subsided but the dizziness persiste. The dizziness is to the point where she lost her balance x 4 days ago and fell on her buttocks.  Patient states a history of vertigo but states this dizziness is a little bit different where she describes it more as feeling off balance than a spinning sensation)  HPI  Carol Conley presents to clinic for evaluation of dizziness with worsening of symptoms when she lays on her right side.  States over the past week she had noticed some worsening of dizziness when she laid on her right side, felt that he room was uneven and she had lost her balance and fell 4 days ago, was evaluated in the ER and states her symptoms of dizziness resolved after being given benadryl IV.  Had gone to urgent care the following day after her dizziness had returned and was given a prescription for meclizine.  Reports no change with this prescription.  Has popping/clicking in her ears at times.  History of recurrent ear infections.  Reports having met with an ENT in the past that recommended surgery for a mucoid cyst and deviated septum.  Reports headache that she had in the ER has resolved.  Depression screen [facility] 2/9 12/08/2018 11/26/2017 10/24/2017  Decreased Interest 0 0 0  Down, Depressed, Hopeless 0 0 0  PHQ - 2 Score 0 0 0  Altered sleeping - 3 0  Tired, decreased energy - 3 3  Change in appetite - 0 0  Feeling bad or failure about yourself  - 0 0  Trouble concentrating - 1 0  Moving slowly or fidgety/restless - 0 0  Suicidal thoughts - 0 0  PHQ-9 Score - 7 3  Difficult doing work/chores - Very difficult Not difficult at all     Social History   Tobacco Use  . Smoking status: Never Smoker  . Smokeless tobacco: Never Used  Substance Use Topics  . Alcohol use: Yes    Comment: occasionally   . [name]    Review of Systems  Constitutional: Negative.   HENT: Negative.   Eyes: Negative.   Respiratory: Negative.   Cardiovascular: Negative.   Gastrointestinal: Negative.   Endocrine: Negative.   Genitourinary: Negative.   Musculoskeletal: Negative.   Skin: Negative.   Allergic/Immunologic: Negative.   Neurological: Positive for dizziness. Negative for tremors, seizures, syncope, facial asymmetry, speech difficulty, weakness, light-headedness, numbness and headaches.  Hematological: Negative.   Psychiatric/Behavioral: Negative.    Per HPI unless specifically indicated above     Objective:    BP 104/61 (BP Location: Left Arm, Patient Position: Sitting, Cuff Size: Normal)   Pulse 91   Temp (!) 97.3 F (36.3 C) (Temporal)   Wt 162 lb 3.2 oz (73.6 kg)   BMI 26.99 kg/m   Wt Readings from Last 3 Encounters:  08/09/19 162 lb 3.2 oz (73.6 kg)  08/05/19 160 lb (72.6 kg)  02/24/19 158 lb 6.4 oz (71.8 kg)    Physical Exam Vitals reviewed.  Constitutional:      General: She is not in acute distress.    Appearance: Normal appearance.  She is well-developed, well-groomed and overweight. She is not ill-appearing or toxic-appearing.  HENT:     Head: Normocephalic.     Right Ear: Ear canal and external ear normal. No laceration, drainage, swelling or tenderness. A middle ear effusion is present. There is no impacted cerumen. No foreign body. No mastoid tenderness. No PE tube. Tympanic membrane is bulging. Tympanic membrane is not injected, scarred, perforated or erythematous.     Left Ear: External ear normal. No decreased hearing noted. No laceration, drainage, swelling or tenderness. There is impacted cerumen.     Ears:     Comments: Clear fluid behind TM Eyes:     General: Lids are normal. Vision  grossly intact.        Right eye: No discharge.        Left eye: No discharge.     Extraocular Movements: Extraocular movements intact.     Conjunctiva/sclera: Conjunctivae normal.     Pupils: Pupils are equal, round, and reactive to light.  Cardiovascular:     Rate and Rhythm: Normal rate and regular rhythm.     Pulses: Normal pulses.     Heart sounds: Normal heart sounds. No murmur. No friction rub. No gallop.   Pulmonary:     Effort: Pulmonary effort is normal. No respiratory distress.     Breath sounds: Normal breath sounds.  Musculoskeletal:     Right lower leg: No edema.     Left lower leg: No edema.  Skin:    General: Skin is warm and dry.     Capillary Refill: Capillary refill takes less than 2 seconds.  Neurological:     General: No focal deficit present.     Mental Status: She is alert and oriented to person, place, and time.     Cranial Nerves: No cranial nerve deficit.     Sensory: No sensory deficit.     Motor: No weakness.     Coordination: Coordination normal.     Gait: Gait normal.  Psychiatric:        Attention and Perception: Attention and perception normal.        Mood and Affect: Mood and affect normal.        Speech: Speech normal.        Behavior: Behavior normal. Behavior is cooperative.        Thought Content: Thought content normal.        Cognition and Memory: Cognition and memory normal.        Judgment: Judgment normal.    Results for orders placed or performed during the hospital encounter of 08/05/19  Urine Culture   Specimen: Urine, Random  Result Value Ref Range   Specimen Description      URINE, RANDOM Performed at [facility], [address], [place], [place] [address]    Special Requests      NONE Performed at [facility], [address] [place], [place] [address]    Culture (A)     <10,000 COLONIES/mL INSIGNIFICANT GROWTH Performed at [facility] [address], [place], [place] [address]    Report  Status 08/06/2019 FINAL   Basic metabolic panel  Result Value Ref Range   Sodium 141 135 - 145 mmol/L   Potassium 3.5 3.5 - 5.1 mmol/L   Chloride 109 98 - 111 mmol/L   CO2 23 22 - 32 mmol/L   Glucose, Bld 104 (H) 70 - 99 mg/dL   BUN 13 6 - 20 mg/dL  Creatinine, Ser 0.82 0.44 - 1.00 mg/dL   Calcium 8.8 (L) 8.9 - 10.3 mg/dL   GFR calc non Af Amer >60 >60 mL/min   GFR calc Af Amer >60 >60 mL/min   Anion gap 9 5 - 15  CBC  Result Value Ref Range   WBC 8.3 4.0 - 10.5 K/uL   RBC 4.59 3.87 - 5.11 MIL/uL   Hemoglobin 12.6 12.0 - 15.0 g/dL   HCT 36.2 36.0 - 46.0 %   MCV 78.9 (L) 80.0 - 100.0 fL   MCH 27.5 26.0 - 34.0 pg   MCHC 34.8 30.0 - 36.0 g/dL   RDW 12.9 11.5 - 15.5 %   Platelets 278 150 - 400 K/uL   nRBC 0.0 0.0 - 0.2 %  Urinalysis, Complete w Microscopic  Result Value Ref Range   Color, Urine YELLOW (A) YELLOW   APPearance HAZY (A) CLEAR   Specific Gravity, Urine 1.018 1.005 - 1.030   pH 5.0 5.0 - 8.0   Glucose, UA NEGATIVE NEGATIVE mg/dL   Hgb urine dipstick NEGATIVE NEGATIVE   Bilirubin Urine NEGATIVE NEGATIVE   Ketones, ur NEGATIVE NEGATIVE mg/dL   Protein, ur NEGATIVE NEGATIVE mg/dL   Nitrite NEGATIVE NEGATIVE   Leukocytes,Ua NEGATIVE NEGATIVE   WBC, UA 0-5 0 - 5 WBC/hpf   Bacteria, UA MANY (A) NONE SEEN   Squamous Epithelial / LPF 0-5 0 - 5   Mucus PRESENT   POC urine preg, ED  Result Value Ref Range   Preg Test, Ur Negative Negative  POC SARS Coronavirus 2 Ag  Result Value Ref Range   SARS Coronavirus 2 Ag NEGATIVE NEGATIVE      Assessment & Plan:   Problem List Items Addressed This Visit      Nervous and Auditory   Eustachian tube dysfunction - Primary    Current eustachian tube dysfunction with large amount of fluid behind right TM with bulging TM.  Will treat with prednisone 68m x 5 days, flonase daily, and mucinex 12063mBID.  Discussed sending in referral to ENT, as had chronic ear infections in the past and history of mucoid cyst for re-evaluation  and if needs surgical management vs. Conservative treatment of eustachian tube dysfunction.  Plan: 1. Begin Prednisone 4023m 5 days 2. Begin Flonase and Mucinex as directed 3. Referral for ENT placed, discussed if has resolution of symptoms does not need to keep appointment, but if symptoms wax/wane best to keep ENT appointment and discuss options with the specialist. 4. Follow up as needed      Relevant Medications   predniSONE (DELTASONE) 20 MG tablet   fluticasone (FLONASE) 50 MCG/ACT nasal spray   guaiFENesin (MUCINEX) 600 MG 12 hr tablet   Other Relevant Orders   Ambulatory referral to ENT    Other Visit Diagnoses    Seasonal allergic rhinitis due to pollen       Relevant Medications   fluticasone (FLONASE) 50 MCG/ACT nasal spray   Subacute maxillary sinusitis       Relevant Medications   predniSONE (DELTASONE) 20 MG tablet   fluticasone (FLONASE) 50 MCG/ACT nasal spray   guaiFENesin (MUCINEX) 600 MG 12 hr tablet      Meds ordered this encounter  Medications  . predniSONE (DELTASONE) 20 MG tablet    Sig: Take 2 tablets (40 mg total) by mouth daily with breakfast for 5 days.    Dispense:  10 tablet    Refill:  0  . fluticasone (FLONASE) 50 MCG/ACT nasal  spray    Sig: Place 2 sprays into both nostrils daily.    Dispense:  16 g    Refill:  2  . guaiFENesin (MUCINEX) 600 MG 12 hr tablet    Sig: Take 2 tablets (1,200 mg total) by mouth 2 (two) times daily for 10 days.    Dispense:  40 tablet    Refill:  0      Follow up plan: Return if symptoms worsen or fail to improve.   Harlin Rain, [place] [facility] Nurse Practitioner [facility] 08/09/2019, 10:48 AM

## 2019-08-09 NOTE — Patient Instructions (Signed)
As we discussed, you have eustachian tube dysfunction.  I have sent in a prescription for Prednisone 40mg  daily x 5 days, flonase 2 sprays in each nostril daily and mucinex 1200mg  every 12 hours.  Continue your Xyzal prescription.  I have sent in a referral to ENT for your eustachian tube dysfunction with history of mucoid cyst and chronic ear infections.  We can plan to see you back as needed for this.  You will receive a survey after today's visit either digitally by e-mail or paper by . Your experiences and feedback matter to .  Please respond so we know how we are doing as we provide care for you.  Call [employer] with any questions/concerns/needs.  It is my goal to be available to you for your health concerns.  Thanks for choosing me to be a partner in your healthcare needs!  [place], FNP-C Family Nurse Practitioner [facility] Phone: [phone]

## 2019-08-09 NOTE — Assessment & Plan Note (Signed)
Current eustachian tube dysfunction with large amount of fluid behind right TM with bulging TM.  Will treat with prednisone 40mg  x 5 days, flonase daily, and mucinex 1200mg  BID.  Discussed sending in referral to ENT, as had chronic ear infections in the past and history of mucoid cyst for re-evaluation and if needs surgical management vs. Conservative treatment of eustachian tube dysfunction.  Plan: 1. Begin Prednisone 40mg  x 5 days 2. Begin Flonase and Mucinex as directed 3. Referral for ENT placed, discussed if has resolution of symptoms does not need to keep appointment, but if symptoms wax/wane best to keep ENT appointment and discuss options with the specialist. 4. Follow up as needed

## 2019-08-13 ENCOUNTER — Other Ambulatory Visit: Payer: Self-pay | Admitting: Otolaryngology

## 2019-08-13 DIAGNOSIS — R42 Dizziness and giddiness: Secondary | ICD-10-CM

## 2019-08-19 ENCOUNTER — Encounter: Payer: Self-pay | Admitting: Gastroenterology

## 2019-08-20 ENCOUNTER — Ambulatory Visit (INDEPENDENT_AMBULATORY_CARE_PROVIDER_SITE_OTHER): Payer: No Typology Code available for payment source | Admitting: Psychology

## 2019-08-20 DIAGNOSIS — F411 Generalized anxiety disorder: Secondary | ICD-10-CM

## 2019-08-20 NOTE — Telephone Encounter (Signed)
Patient wanted to come in the office made appointment for Tuesday

## 2019-08-24 ENCOUNTER — Telehealth: Payer: Self-pay

## 2019-08-24 ENCOUNTER — Other Ambulatory Visit: Payer: Self-pay

## 2019-08-24 ENCOUNTER — Encounter: Payer: Self-pay | Admitting: Gastroenterology

## 2019-08-24 ENCOUNTER — Ambulatory Visit (INDEPENDENT_AMBULATORY_CARE_PROVIDER_SITE_OTHER): Payer: No Typology Code available for payment source | Admitting: Gastroenterology

## 2019-08-24 VITALS — BP 120/83 | HR 96 | Temp 98.1°F | Wt 157.1 lb

## 2019-08-24 DIAGNOSIS — K30 Functional dyspepsia: Secondary | ICD-10-CM

## 2019-08-24 DIAGNOSIS — R1013 Epigastric pain: Secondary | ICD-10-CM | POA: Diagnosis not present

## 2019-08-24 DIAGNOSIS — D508 Other iron deficiency anemias: Secondary | ICD-10-CM

## 2019-08-24 DIAGNOSIS — K3184 Gastroparesis: Secondary | ICD-10-CM

## 2019-08-24 MED ORDER — METOCLOPRAMIDE HCL 5 MG PO TABS: 5.0000 mg | ORAL_TABLET | Freq: Three times a day (TID) | ORAL | 0 refills | Status: DC

## 2019-08-24 MED ORDER — FUSION PLUS PO CAPS: 1.0000 | ORAL_CAPSULE | Freq: Every day | ORAL | 1 refills | Status: AC

## 2019-08-24 NOTE — Telephone Encounter (Signed)
We received by fax [facility] paper work for this patient. Please advised if you agreed to do this

## 2019-08-24 NOTE — Progress Notes (Signed)
Carol Darby, MD [address]  [place]  [place], [place] [address]  Main: [phone]  Fax: [phone]    Gastroenterology Consultation  Referring Provider:     Verl Bangs, FNP Primary Care Physician:  Verl Bangs, FNP Primary Gastroenterologist:  Dr. Lucilla Lame Reason for Consultation: Dyspepsia, flareup        HPI:   Carol Conley is a 29 y.o. female referred by Dr. Lorine Bears, Lupita Raider, FNP  for consultation & management of dyspepsia.  Patient reports that she has been experiencing constellation of gastrointestinal symptoms including early morning nausea, abdominal bloating, postprandial diarrhea, left upper quadrant pain, severe rectal pain.  Patient reports that she has been suffering from constipation since her childhood and she has GI issues growing up.  She acknowledges drinking soft drinks and fruit juices several times a day.  She does not drink water.  She underwent EGD for globus sensation/difficulty swallowing in the past and was dilated twice.  She had esophageal and duodenal biopsies which were unremarkable.  She underwent gastric emptying study which revealed mild delayed gastric emptying at 4-hour interval for which she started on Reglan 5 mg as needed.  Patient reports that Reglan provides relief of nausea.  However she continues to have other GI symptoms.  She is currently on [name] for regurgitation.  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  She denies tobacco use, alcohol use  Follow up virtual visit 05/07/19 Carol Conley a 29 y.o.femalewith IBS diarrhea, dyspepsia, mild gastroparesis.  Since last visit, she underwent further evaluation including H. pylori IgG which is negative, normal fecal calprotectin levels, severe iron  deficiency, normal CRP, has selective IgA deficiency.  Patient started taking oral iron.  She continues to have several bouts of diarrhea.  She stopped taking Reglan.  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  Follow up visit 08/24/19 She reports that she has done very well with regards to her GI symptoms for 2 to 3 months.  However, she went to ER on 5/20 secondary to severe migraine headaches.  Patient made an urgent visit due to recent flareup of symptoms that started 1 week ago.  Symptoms include constant heartburn, regurgitation of acid, epigastric pain and bloating.  She has heartburn at night as well.  She continues to take [name] 60 mg daily.  She reports that she had severe migraine episode about 3 weeks ago which she was dealing with.  She was on several different medications that has resulted in severe constipation with impaction, required manual disimpaction.  She felt better for 3 days after disimpaction followed by flareup of GI symptoms as above.  She is currently on Excedrin once a day.  She has been missing her work often due to these flareups.  She is a Writer at [employer].  She is requesting for FMLA.  She could not undergo gastric emptying study as it was not approved by her insurance.  She is currently off Reglan.  She is also requesting for food allergy profile and alpha gal panel  She denies family history of GI malignancy, inflammatory bowel disease in first-degree relatives NSAIDs: None  Antiplts/Anticoagulants/Anti thrombotics: None  GI Procedures:  EGD 12/2016 - Small hiatal hernia. - Benign-appearing esophageal stenosis. Dilated. -  Food (residue) in the stomach. - Normal examined duodenum. - Biopsy performed in the middle third of the esophagus.    EGD 03/2016 - Benign-appearing esophageal stenosis. Dilated. - Small hiatal hernia. - Normal stomach. - Normal examined duodenum. Biopsied.  DIAGNOSIS:  A. SMALL BOWEL; BIOPSY:    - SMALL BOWEL MUCOSA WITH INTACT VILLOUS ARCHITECTURE.  - NEGATIVE FOR INTRAEPITHELIAL LYMPHOCYTOSIS, DYSPLASIA AND MALIGNANCY.   B. MID ESOPHAGUS; BIOPSY:  - NO PATHOLOGIC CHANGE.  Past Medical History:  Diagnosis Date  . Allergy   . Asthma    as child  . Colitis   . Esophageal dysphagia   . GERD (gastroesophageal reflux disease)   . Headache    sinus  . IBS (irritable bowel syndrome)   . IgA deficiency, selective ([place])   . Motion sickness    cars, planes  . Sinusitis   . Stricture and stenosis of esophagus     Past Surgical History:  Procedure Laterality Date  . ESOPHAGEAL DILATION  04/08/2016   Procedure: ESOPHAGEAL DILATION;  Surgeon: Lucilla Lame, MD;  Location: [place];  Service: Endoscopy;;  . ESOPHAGEAL DILATION  12/26/2016   Procedure: ESOPHAGEAL DILATION;  Surgeon: Lucilla Lame, MD;  Location: [facility];  Service: Endoscopy;;  . ESOPHAGOGASTRODUODENOSCOPY (EGD) WITH PROPOFOL N/A 04/08/2016   Procedure: ESOPHAGOGASTRODUODENOSCOPY (EGD) WITH PROPOFOL;  Surgeon: Lucilla Lame, MD;  Location: [place];  Service: Endoscopy;  Laterality: N/A;  . ESOPHAGOGASTRODUODENOSCOPY (EGD) WITH PROPOFOL N/A 12/26/2016   Procedure: ESOPHAGOGASTRODUODENOSCOPY (EGD) WITH PROPOFOL;  Surgeon: Lucilla Lame, MD;  Location: [place];  Service: Endoscopy;  Laterality: N/A;  UPREG  . OVARIAN CYST REMOVAL Right    2007  . [facility]      Current Outpatient Medications:  .  [name];[name], Inhale 2 puffs into the lungs every 6 (six) hours as needed for wheezing or shortness of breath., Disp: 1 Inhaler, Rfl: 0 .  aspirin-acetaminophen-caffeine (EXCEDRIN MIGRAINE) 250-250-65 MG per tablet, Take 2 tablets by mouth every 8 (eight) hours as needed for migraine. , Disp: , Rfl:  .  azelastine (ASTELIN) 0.1 % nasal spray, Place 2 sprays into both nostrils 2 (two) times daily., Disp: , Rfl:  .  [name] 60  MG capsule, TAKE 1 CAPSULE BY MOUTH DAILY, Disp: 60 capsule, Rfl: 6 .  [name], TAKE 1 CAPSULE BY MOUTH 4 TIMES DAILY - BEFORE MEALS AND AT BEDTIME., Disp: 90 capsule, Rfl: 3 .  fluticasone (FLONASE) 50 MCG/ACT nasal spray, Place 2 sprays into both nostrils daily., Disp: 16 g, Rfl: 2 .  [name] 1-20 MG-MCG tablet, TAKE 1 TABLET BY MOUTH DAILY CONTINUOUSLY AS DIRECTED, Disp: 3 Package, Rfl: 4 .  levocetirizine (XYZAL) 5 MG tablet, TAKE 1 TABLET BY MOUTH EVERY EVENING, Disp: 30 tablet, Rfl: 5 .  Lidocaine HCl Monohydrate POWD, , Disp: , Rfl:  .  Lidocaine, [name], [name], Relief of anorectal pain and itching: Apply small, pea-sized amount of cream to affected area up to 6 times daily as needed, Disp: 30 g, Rfl: 1 .  meclizine (ANTIVERT) 12.5 MG tablet, Take 1 tablet (12.5 mg total) by mouth 3 (three) times daily as needed for dizziness., Disp: 30 tablet, Rfl: 0 .  Melatonin 1 MG TABS, Take 1-2 tablets (1-2 mg total) by mouth at bedtime., Disp: , Rfl:  .  ondansetron (ZOFRAN) 4 MG tablet, Take 4 mg by mouth every 8 (eight) hours as needed  for nausea or vomiting., Disp: , Rfl:  .  [name], DISSOLVE 1 TABLET BY MOUTH EVERY 8 HOURS AS NEEDED FOR NAUSEA OR VOMITING, Disp: 30 tablet, Rfl: 3 .  Pediatric Multivit-Minerals-C (KIDS GUMMY BEAR VITAMINS PO), Take by mouth., Disp: , Rfl:  .  [name], TAKE 1 TABLET BY MOUTH EVERY 6 HOURS AS NEEDED FOR NAUSEA OR VOMITING., Disp: 30 tablet, Rfl: 6 .  SUMAtriptan (IMITREX) 50 MG tablet, Take 1 tablet at onset of headache May repeat every 2 hours x 3 total doses in 24 hours if headache persists or returns., Disp: 10 tablet, Rfl: 0 .  [name], Take 1 tablet (5 mg total) by mouth 4 (four) times daily -  before meals and at bedtime for 14 days., Disp: 56 tablet, Rfl: 0   Family History  Problem Relation Age of Onset  . Cancer  Father        skin  . Thyroid disease Mother      Social History   Tobacco Use  . Smoking status: Never Smoker  . Smokeless tobacco: Never Used  Substance Use Topics  . Alcohol use: Yes    Comment: occasionally   . [name]    Allergies as of 08/24/2019 - Review Complete 08/24/2019  Allergen Reaction Noted  . Banana Itching 04/05/2016  . [name] Hives 08/10/2014  . Cantaloupe (diagnostic) Itching 04/05/2016  . [name] Hives 08/10/2014    Review of Systems:    All systems reviewed and negative except where noted in HPI.   Physical Exam:  BP 120/83 (BP Location: Left Arm, Patient Position: Sitting, Cuff Size: Normal)   Pulse 96   Temp 98.1 F (36.7 C) (Oral)   Wt 157 lb 2 oz (71.3 kg)   BMI 26.15 kg/m  No LMP recorded. (Menstrual status: Oral contraceptives).  General:   Alert,  Well-developed, well-nourished, pleasant and cooperative in NAD Head:  Normocephalic and atraumatic. Eyes:  Sclera clear, no icterus.   Conjunctiva pink. Ears:  Normal auditory acuity. Nose:  No deformity, discharge, or lesions. Mouth:  No deformity or lesions,oropharynx pink & moist. Neck:  Supple; no masses or thyromegaly. Lungs:  Respirations even and unlabored.  Clear throughout to auscultation.   No wheezes, crackles, or rhonchi. No acute distress. Heart:  Regular rate and rhythm; no murmurs, clicks, rubs, or gallops. Abdomen:  Normal bowel sounds. Soft, nontender and non-distended without masses, hepatosplenomegaly or hernias noted.  No guarding or rebound tenderness.   Rectal: Severe tenderness in the posterior wall of the anal canal on digital rectal exam, normal perianal skin Msk:  Symmetrical without gross deformities. Good, equal movement & strength bilaterally. Pulses:  Normal pulses noted. Extremities:  No clubbing or edema.  No cyanosis. Neurologic:  Alert and oriented x3;  grossly normal neurologically. Skin:  Intact without significant lesions or  rashes. No jaundice. Lymph Nodes:  No significant cervical adenopathy. Psych:  Alert and cooperative. Normal mood and affect.  Imaging Studies: Reviewed  Assessment and Plan:   Valori Hollenkamp is a 29 y.o. female with IBS diarrhea, dyspepsia, mild gastroparesis.  Work-up thus far including upper endoscopy with gastric and duodenal biopsies were negative in the past, H. pylori IgG negative, normal fecal calprotectin levels, normal CRP.  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  She does have chronic iron deficiency without evidence of anemia in the absence  of menorrhagia.  Patient is currently off Reglan. 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  Dyspepsia Patient presents with 1 week history of flareup of dyspepsia symptoms Continue [name] 40 mg daily Trial of short course of Reglan 5 mg before each meal and at bedtime Recommend gastric emptying study after her flareup resolves and off Reglan Patient will check with her insurance if food allergy profile and alpha gal panel are approved I have also discussed about trial of low FODMAP diet, information provided  Follow up in 4 to 6 weeks   Carol Darby, MD

## 2019-08-24 NOTE — Telephone Encounter (Signed)
Agree, I have discussed about FMLA for her flareup of symptoms of probable underlying gastroparesis Agreed for 3 days a month of FMLA for 6 months as needed  Also, please order gastric emptying study for gastroparesis.  She will check with her insurance if it will be approved  Thank you  Keshanna Riso

## 2019-08-25 NOTE — Telephone Encounter (Signed)
Filled out [employer] and faxed it back to [facility] company. Please advised if I can go on and scheduled the gastric emptying study. Is it approved by insurance

## 2019-08-27 ENCOUNTER — Other Ambulatory Visit: Payer: Self-pay

## 2019-08-27 ENCOUNTER — Ambulatory Visit
Admission: RE | Admit: 2019-08-27 | Discharge: 2019-08-27 | Disposition: A | Discharge status: Home / Self Care | Payer: No Typology Code available for payment source | Source: Ambulatory Visit | Attending: Otolaryngology | Admitting: Otolaryngology

## 2019-08-27 DIAGNOSIS — R42 Dizziness and giddiness: Secondary | ICD-10-CM | POA: Insufficient documentation

## 2019-08-27 MED ORDER — GADOBUTROL 1 MMOL/ML IV SOLN
7.0000 mL | Freq: Once | INTRAVENOUS | Status: AC | PRN
  Administered 2019-08-27: 7 mL via INTRAVENOUS

## 2019-08-27 NOTE — Telephone Encounter (Signed)
Precert for gastric emptying study has been started. Will let you know once it is approved.

## 2019-09-02 ENCOUNTER — Telehealth: Payer: Self-pay

## 2019-09-02 NOTE — Telephone Encounter (Signed)
Gastric emptying study is approved by patient insurance. Patient can not do  Next week Tuesday or Wednesday  Week of the 27th Wednesday or Thursday   week of the 5th Tuesday or Wednesday  Hold stomach medication 6 hrs before procedure.  July 1st at 10am arrived at 9:30am. Nothing to eat or drink 6 hours prior to procedure.  Called patient and patient verbalized understanding

## 2019-09-06 ENCOUNTER — Ambulatory Visit (INDEPENDENT_AMBULATORY_CARE_PROVIDER_SITE_OTHER): Payer: No Typology Code available for payment source | Admitting: Psychology

## 2019-09-06 DIAGNOSIS — F411 Generalized anxiety disorder: Secondary | ICD-10-CM

## 2019-09-17 ENCOUNTER — Encounter
Admission: RE | Admit: 2019-09-17 | Discharge: 2019-09-17 | Disposition: A | Discharge status: Home / Self Care | Payer: No Typology Code available for payment source | Source: Ambulatory Visit | Attending: Gastroenterology | Admitting: Gastroenterology

## 2019-09-17 ENCOUNTER — Other Ambulatory Visit: Payer: Self-pay

## 2019-09-17 DIAGNOSIS — R1013 Epigastric pain: Secondary | ICD-10-CM | POA: Diagnosis not present

## 2019-09-17 MED ORDER — TECHNETIUM TC 99M SULFUR COLLOID FILTERED
2.0000 | Freq: Once | INTRAVENOUS | Status: AC | PRN
  Administered 2019-09-17: 2.47 via INTRADERMAL

## 2019-09-28 ENCOUNTER — Telehealth (INDEPENDENT_AMBULATORY_CARE_PROVIDER_SITE_OTHER): Payer: No Typology Code available for payment source | Admitting: Gastroenterology

## 2019-09-28 ENCOUNTER — Encounter: Payer: Self-pay | Admitting: Gastroenterology

## 2019-09-28 DIAGNOSIS — K640 First degree hemorrhoids: Secondary | ICD-10-CM | POA: Diagnosis not present

## 2019-09-28 DIAGNOSIS — K580 Irritable bowel syndrome with diarrhea: Secondary | ICD-10-CM

## 2019-09-28 DIAGNOSIS — D509 Iron deficiency anemia, unspecified: Secondary | ICD-10-CM

## 2019-09-28 MED ORDER — HYDROCORTISONE (PERIANAL) 2.5 % EX CREA: 1.0000 "application " | TOPICAL_CREAM | Freq: Two times a day (BID) | CUTANEOUS | 1 refills | Status: DC

## 2019-09-28 NOTE — Progress Notes (Signed)
Sherri Sear, MD [address]  [place]  [place],  [address]  Main: [phone]  Fax: [phone]    Gastroenterology Consultation Video Visit  Referring Provider:     Verl Bangs, FNP Primary Care Physician:  Verl Bangs, FNP Primary Gastroenterologist:  Dr. Cephas Darby Reason for Consultation: Dyspepsia        HPI:   Carol Conley is a 29 y.o. female referred by Dr. Lorine Bears, Lupita Raider, FNP  for consultation & management of dyspepsia  Virtual Visit Video Note  I connected with Keyly Baldonado Loyd on 09/29/19 at  2:00 PM EDT by video and verified that I am speaking with the correct person using two identifiers.   I discussed the limitations, risks, security and privacy concerns of performing an evaluation and management service by video and the availability of in person appointments. I also discussed with the patient that there may be a patient responsible charge related to this service. The patient expressed understanding and agreed to proceed.  Location of the Patient: Home  Location of the provider: Office  Persons participating in the visit: Patient and provider only   History of Present Illness: Carol Conley reports doing okay since most recent flareup of dyspepsia.  She responded well to Reglan.  She underwent gastric emptying study of Reglan, which came back normal.  She does have intermittent episodes of diarrhea, managed with Bentyl.  She also has iron deficiency without anemia of unclear etiology.  She does not have celiac disease. She does report severe perianal burning and itching and is requesting if she can try topical nitroglycerin ointment.  She denies sharp rectal pain.  NSAIDs: None  Antiplts/Anticoagulants/Anti thrombotics: None  GI Procedures:  EGD 12/2016 - Small hiatal hernia. - Benign-appearing esophageal stenosis. Dilated. - Food (residue) in the stomach. - Normal examined duodenum. - Biopsy performed in the middle third  of the esophagus.    EGD 03/2016 - Benign-appearing esophageal stenosis. Dilated. - Small hiatal hernia. - Normal stomach. - Normal examined duodenum. Biopsied.  DIAGNOSIS:  A. SMALL BOWEL; BIOPSY:  - SMALL BOWEL MUCOSA WITH INTACT VILLOUS ARCHITECTURE.  - NEGATIVE FOR INTRAEPITHELIAL LYMPHOCYTOSIS, DYSPLASIA AND MALIGNANCY.   B. MID ESOPHAGUS; BIOPSY:  - NO PATHOLOGIC CHANGE.  Past Medical History:  Diagnosis Date   Allergy    Asthma    as child   Colitis    Esophageal dysphagia    GERD (gastroesophageal reflux disease)    Headache    sinus   IBS (irritable bowel syndrome)    IgA deficiency, selective (HCC)    Motion sickness    cars, planes   Sinusitis    Stricture and stenosis of esophagus     Past Surgical History:  Procedure Laterality Date   ESOPHAGEAL DILATION  04/08/2016   Procedure: ESOPHAGEAL DILATION;  Surgeon: Lucilla Lame, MD;  Location: [facility];  Service: Endoscopy;;   ESOPHAGEAL DILATION  12/26/2016   Procedure: ESOPHAGEAL DILATION;  Surgeon: Lucilla Lame, MD;  Location: [facility];  Service: Endoscopy;;   ESOPHAGOGASTRODUODENOSCOPY (EGD) WITH PROPOFOL N/A 04/08/2016   Procedure: ESOPHAGOGASTRODUODENOSCOPY (EGD) WITH PROPOFOL;  Surgeon: Lucilla Lame, MD;  Location: [facility];  Service: Endoscopy;  Laterality: N/A;   ESOPHAGOGASTRODUODENOSCOPY (EGD) WITH PROPOFOL N/A 12/26/2016   Procedure: ESOPHAGOGASTRODUODENOSCOPY (EGD) WITH PROPOFOL;  Surgeon: Lucilla Lame, MD;  Location: [facility];  Service: Endoscopy;  Laterality: N/A;  UPREG   OVARIAN CYST REMOVAL Right    2007   wisdom  theeth      Current Outpatient Medications:    [name];[name], Inhale 2 puffs into the lungs every 6 (six) hours as needed for wheezing or shortness of breath., Disp: 1 Inhaler, Rfl: 0   aspirin-acetaminophen-caffeine (EXCEDRIN MIGRAINE) 250-250-65 MG per tablet, Take  2 tablets by mouth every 8 (eight) hours as needed for migraine. , Disp: , Rfl:    azelastine (ASTELIN) 0.1 % nasal spray, Place 2 sprays into both nostrils 2 (two) times daily., Disp: , Rfl:    [name] 60 MG capsule, TAKE 1 CAPSULE BY MOUTH DAILY, Disp: 60 capsule, Rfl: 6   [name], TAKE 1 CAPSULE BY MOUTH 4 TIMES DAILY - BEFORE MEALS AND AT BEDTIME., Disp: 90 capsule, Rfl: 3   fluticasone (FLONASE) 50 MCG/ACT nasal spray, Place 2 sprays into both nostrils daily., Disp: 16 g, Rfl: 2   [name] 1-20 MG-MCG tablet, TAKE 1 TABLET BY MOUTH DAILY CONTINUOUSLY AS DIRECTED, Disp: 3 Package, Rfl: 4   Lidocaine HCl Monohydrate POWD, , Disp: , Rfl:    Lidocaine, [name], [name], Relief of anorectal pain and itching: Apply small, pea-sized amount of cream to affected area up to 6 times daily as needed, Disp: 30 g, Rfl: 1   meclizine (ANTIVERT) 12.5 MG tablet, Take 1 tablet (12.5 mg total) by mouth 3 (three) times daily as needed for dizziness., Disp: 30 tablet, Rfl: 0   Melatonin 1 MG TABS, Take 1-2 tablets (1-2 mg total) by mouth at bedtime., Disp: , Rfl:    [name], DISSOLVE 1 TABLET BY MOUTH EVERY 8 HOURS AS NEEDED FOR NAUSEA OR VOMITING, Disp: 30 tablet, Rfl: 3   Pediatric Multivit-Minerals-C (KIDS GUMMY BEAR VITAMINS PO), Take by mouth., Disp: , Rfl:    [name], TAKE 1 TABLET BY MOUTH EVERY 6 HOURS AS NEEDED FOR NAUSEA OR VOMITING., Disp: 30 tablet, Rfl: 6   SUMAtriptan (IMITREX) 50 MG tablet, Take 1 tablet at onset of headache May repeat every 2 hours x 3 total doses in 24 hours if headache persists or returns., Disp: 10 tablet, Rfl: 0   cyclobenzaprine (FLEXERIL) 5 MG tablet, Take 0.5-1 tablets (2.5-5 mg total) by mouth 3 (three) times daily as needed for muscle spasms., Disp: 30 tablet, Rfl: 1   hydrocortisone (ANUSOL-HC) 2.5 % rectal cream, Place 1 application rectally 2 (two)  times daily., Disp: 30 g, Rfl: 1   ibuprofen (ADVIL) 600 MG tablet, Take 1 tablet (600 mg total) by mouth every 6 (six) hours as needed., Disp: 30 tablet, Rfl: 1   levocetirizine (XYZAL) 5 MG tablet, TAKE 1 TABLET BY MOUTH EVERY EVENING, Disp: 30 tablet, Rfl: 1   metoCLOPramide (REGLAN) 10 MG tablet, , Disp: , Rfl:    Family History  Problem Relation Age of Onset   Cancer Father        skin   Thyroid disease Mother      Social History   Tobacco Use   Smoking status: Never Smoker   Smokeless tobacco: Never Used  Vaping Use   Vaping Use: Never used  Substance Use Topics   Alcohol use: Yes    Comment: occasionally    [name]    Allergies as of 09/28/2019 - Review Complete 09/28/2019  Allergen Reaction Noted   Banana Itching 04/05/2016   [name] Hives 08/10/2014   Cantaloupe (diagnostic) Itching 04/05/2016   [name]  Hives 08/10/2014     Imaging Studies: Reviewed  Assessment and Plan:   Kamarri Fischetti is a 29 y.o. female with IBS diarrhea, dyspepsia, mild gastroparesis.  Work-up thus far including upper endoscopy with gastric and duodenal biopsies were negative in the past, H. pylori IgG negative, normal fecal calprotectin levels, normal CRP. 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 She does have chronic iron deficiency without evidence of anemia in the absence of menorrhagia. Patient is currently off Reglan. 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  Dyspepsia EGD is unremarkable Continue [name] 40 mg daily Reglan as needed during flareup of dyspepsia, although gastric emptying study came back normal Patient would like to get tested for food allergy profile and alpha gal panel, ordered  Rectal pain and burning Trial of hydrocortisone 2.5% 2 times daily for 2 weeks  IBS-diarrhea Bentyl as needed  Iron  deficiency without anemia Check intrinsic factor antibodies, antiparietal cell antibodies Recheck iron studies, B12 and folate panel    Follow Up Instructions:   I discussed the assessment and treatment plan with the patient. The patient was provided an opportunity to ask questions and all were answered. The patient agreed with the plan and demonstrated an understanding of the instructions.   The patient was advised to call back or seek an in-person evaluation if the symptoms worsen or if the condition fails to improve as anticipated.  I provided 16 minutes of face-to-face time during this encounter.   Follow up in 3 months   Cephas Darby, MD

## 2019-09-29 ENCOUNTER — Other Ambulatory Visit: Payer: Self-pay | Admitting: Nurse Practitioner

## 2019-09-29 ENCOUNTER — Encounter: Payer: Self-pay | Admitting: Family Medicine

## 2019-09-29 ENCOUNTER — Other Ambulatory Visit: Payer: Self-pay

## 2019-09-29 ENCOUNTER — Ambulatory Visit (INDEPENDENT_AMBULATORY_CARE_PROVIDER_SITE_OTHER): Payer: No Typology Code available for payment source | Admitting: Family Medicine

## 2019-09-29 VITALS — BP 104/62 | HR 92 | Temp 98.2°F | Ht 65.0 in | Wt 163.4 lb

## 2019-09-29 DIAGNOSIS — J0100 Acute maxillary sinusitis, unspecified: Secondary | ICD-10-CM

## 2019-09-29 DIAGNOSIS — M545 Low back pain, unspecified: Secondary | ICD-10-CM

## 2019-09-29 DIAGNOSIS — R252 Cramp and spasm: Secondary | ICD-10-CM | POA: Diagnosis not present

## 2019-09-29 DIAGNOSIS — J301 Allergic rhinitis due to pollen: Secondary | ICD-10-CM

## 2019-09-29 MED ORDER — IBUPROFEN 600 MG PO TABS: 600.0000 mg | ORAL_TABLET | Freq: Four times a day (QID) | ORAL | 1 refills | Status: DC | PRN

## 2019-09-29 MED ORDER — CYCLOBENZAPRINE HCL 5 MG PO TABS: 2.5000 mg | ORAL_TABLET | Freq: Three times a day (TID) | ORAL | 1 refills | Status: DC | PRN

## 2019-09-29 NOTE — Assessment & Plan Note (Signed)
See low back pain A/P

## 2019-09-29 NOTE — Patient Instructions (Signed)
1. Start Flexeril 5mg  (muscle relaxant) - use half or whole tablet up to 3 times daily (may make you drowsy)  2. Begin ibuprofen 600mg  every 6-8 hours as needed  3. Increase Tylenol to 1000mg  up to 3 times daily for breakthrough pain for 3-5 days then only as needed  4. Use moist heat or heating pad on back, and have family member help with soft tissue massage if needed  Please schedule a follow-up appointment with in 2 to 4 weeks if symptoms not improving, or sooner if worsening             Low Back Pain Exercises  See other page with pictures of each exercise.  Start with 1 or 2 of these exercises that you are most comfortable with. Do not do any exercises that cause you significant worsening pain. Some of these may cause some "stretching soreness" but it should go away after you stop the exercise, and get better over time. Gradually increase up to 3-4 exercises as tolerated.  Standing hamstring stretch: Place the heel of your leg on a stool about 15 inches high. Keep your knee straight. Lean forward, bending at the hips until you feel a mild stretch in the back of your thigh. Make sure you do not roll your shoulders and bend at the waist when doing this or you will stretch your lower back instead. Hold the stretch for 15 to 30 seconds. Repeat 3 times. Repeat the same stretch on your other leg.  Cat and camel: Get down on your hands and knees. Let your stomach sag, allowing your back to curve downward. Hold this position for 5 seconds. Then arch your back and hold for 5 seconds. Do 3 sets of 10.  Quadriped Arm/Leg Raises: Get down on your hands and knees. Tighten your abdominal muscles to stiffen your spine. While keeping your abdominals tight, raise one arm and the opposite leg away from you. Hold this position for 5 seconds. Lower your arm and leg slowly and alternate sides. Do this 10 times on each side.  Pelvic tilt: Lie on your back with your knees bent and your feet flat  on the floor. Tighten your abdominal muscles and push your lower back into the floor. Hold this position for 5 seconds, then relax. Do 3 sets of 10.  Partial curl: Lie on your back with your knees bent and your feet flat on the floor. Tighten your stomach muscles and flatten your back against the floor. Tuck your chin to your chest. With your hands stretched out in front of you, curl your upper body forward until your shoulders clear the floor. Hold this position for 3 seconds. Don't hold your breath. It helps to breathe out as you lift your shoulders up. Relax. Repeat 10 times. Build to 3 sets of 10. To challenge yourself, clasp your hands behind your head and keep your elbows out to the side.  Lower trunk rotation: Lie on your back with your knees bent and your feet flat on the floor. Tighten your abdominal muscles and push your lower back into the floor. Keeping your shoulders down flat, gently rotate your legs to one side, then the other as far as you can. Repeat 10 to 20 times.  Single knee to chest stretch: Lie on your back with your legs straight out in front of you. Bring one knee up to your chest and grasp the back of your thigh. Pull your knee toward your chest, stretching your buttock  muscle. Hold this position for 15 to 30 seconds and return to the starting position. Repeat 3 times on each side.  Double knee to chest: Lie on your back with your knees bent and your feet flat on the floor. Tighten your abdominal muscles and push your lower back into the floor. Pull both knees up to your chest. Hold for 5 seconds and repeat 10 to 20 times.  Warning symptoms of possible EMERGENCY SPINAL CORD COMPRESSION (also called CAUDA EQUINA SYNDROME) - Leg or muscle weakness, difficulty lifting or heavy muscles that aren't working (not talking about pain or numbness) - Numbness in your groin or saddle region - Unable to control your bowel or bladder with incontinence IF you get any of these symptoms this  potentially could be a serious spinal cord injury and recommend that you go directly to the Hospital Emergency Dept  We will plan to see you back as needed for this  You will receive a survey after today's visit either digitally by e-mail or paper by USPS mail. Your experiences and feedback matter to [place].  Please respond so we know how we are doing as we provide care for you.  Call us with any questions/concerns/needs.  It is my goal to be available to you for your health concerns.  Thanks for choosing me to be a partner in your healthcare needs!  Charlaine Dalton, FNP-C Family Nurse Practitioner [facility] Phone: [phone]

## 2019-09-29 NOTE — Assessment & Plan Note (Signed)
Low back pain with cramp/spasm likely secondary to new workout routine and being deconditioned.  Discussed new workout routines and starting slow, working on good form, strengthening core muscles, good body mechanics.  Discussed use of ibuprofen, acetaminophen and cyclobenzaprine as directed, to help with current low back pain and cramp/spasm of left lower back.  Back exercises provided.  Plan: 1. Begin medication regimen as discussed (cyclobenzaprine 2.5-5mg  TID PRN, along with ibuprofen and/or acetaminophen) 2. Using heat/cold therapy as needed for symptoms 3. Working on back stretch/exercises to help reduce back pain 4. RTC PRN

## 2019-09-29 NOTE — Progress Notes (Signed)
Subjective:    Patient ID: Carol Conley, female    DOB: 08-Dec-1990, 29 y.o.   MRN: [id number]  Carol Conley is a 29 y.o. female presenting on 09/29/2019 for Back Pain   HPI  Carol Conley presents to clinic for concerns of lower back pain x 1.5 weeks.  Reports she has been starting a new workout, that includes squats, had some bilateral lower back pain that is worsened with sudden movement, prolonged standing, sitting and bending over.  Was beginning to feel better until yesterday she started to have an IBS flare and her low back symptoms started to worsen.  Denies any numbness, tingling, weakness, foot drop, saddle anesthesia, or loss of bowel/bladder function.  Depression screen [facility] 2/9 12/08/2018 11/26/2017 10/24/2017  Decreased Interest 0 0 0  Down, Depressed, Hopeless 0 0 0  PHQ - 2 Score 0 0 0  Altered sleeping - 3 0  Tired, decreased energy - 3 3  Change in appetite - 0 0  Feeling bad or failure about yourself  - 0 0  Trouble concentrating - 1 0  Moving slowly or fidgety/restless - 0 0  Suicidal thoughts - 0 0  PHQ-9 Score - 7 3  Difficult doing work/chores - Very difficult Not difficult at all    Social History   Tobacco Use  . Smoking status: Never Smoker  . Smokeless tobacco: Never Used  Vaping Use  . Vaping Use: Never used  Substance Use Topics  . Alcohol use: Yes    Comment: occasionally   . [name]    Review of Systems  Constitutional: Negative.   HENT: Negative.   Eyes: Negative.   Respiratory: Negative.   Cardiovascular: Negative.   Gastrointestinal: Negative.   Endocrine: Negative.   Genitourinary: Negative.   Musculoskeletal: Positive for back pain. Negative for arthralgias, gait problem, joint swelling, myalgias, neck pain and neck stiffness.  Skin: Negative.   Allergic/Immunologic: Negative.   Neurological: Negative.   Hematological: Negative.   Psychiatric/Behavioral: Negative.    Per HPI unless specifically indicated above       Objective:    BP 104/62 (BP Location: Right Arm)   Pulse 92   Temp 98.2 F (36.8 C) (Oral)   Ht 5\' 5"  (1.651 m)   Wt 163 lb 6.4 oz (74.1 kg)   BMI 27.19 kg/m   Wt Readings from Last 3 Encounters:  09/29/19 163 lb 6.4 oz (74.1 kg)  08/24/19 157 lb 2 oz (71.3 kg)  08/09/19 162 lb 3.2 oz (73.6 kg)    Physical Exam Vitals reviewed.  Constitutional:      General: She is not in acute distress.    Appearance: Normal appearance. She is well-developed, well-groomed and overweight. She is not ill-appearing or toxic-appearing.  HENT:     Head: Normocephalic and atraumatic.     Nose:     Comments: 08/11/19 is in place, covering mouth and nose. Eyes:     General: Lids are normal. Vision grossly intact.        Right eye: No discharge.        Left eye: No discharge.     Extraocular Movements: Extraocular movements intact.     Conjunctiva/sclera: Conjunctivae normal.     Pupils: Pupils are equal, round, and reactive to light.  Cardiovascular:     Rate and Rhythm: Normal rate and regular rhythm.     Pulses: Normal pulses.          Dorsalis pedis pulses are  2+ on the right side and 2+ on the left side.  Pulmonary:     Effort: Pulmonary effort is normal. No respiratory distress.  Musculoskeletal:        General: Tenderness present.     Cervical back: Normal.     Thoracic back: Normal.     Lumbar back: Spasms and tenderness present. No swelling. Normal range of motion. Negative right straight leg raise test and negative left straight leg raise test.       Back:     Right lower leg: No edema.     Left lower leg: No edema.     Comments: Mild tenderness to bilateral lumbar paraspinal muscles with palpation  Skin:    General: Skin is warm and dry.     Capillary Refill: Capillary refill takes less than 2 seconds.  Neurological:     General: No focal deficit present.     Mental Status: She is alert and oriented to person, place, and time.  Psychiatric:        Attention and Perception:  Attention and perception normal.        Mood and Affect: Mood and affect normal.        Speech: Speech normal.        Behavior: Behavior normal. Behavior is cooperative.        Thought Content: Thought content normal.        Cognition and Memory: Cognition and memory normal.        Judgment: Judgment normal.     Results for orders placed or performed during the hospital encounter of 08/05/19  Urine Culture   Specimen: Urine, Random  Result Value Ref Range   Specimen Description      URINE, RANDOM Performed at [facility], [address] [place], [place] [address]    Special Requests      NONE Performed at [facility], [address] [place], [place] [address]    Culture (A)     <10,000 COLONIES/mL INSIGNIFICANT GROWTH Performed at [facility], [address] [place], [place] [address]    Report Status 08/06/2019 FINAL   Basic metabolic panel  Result Value Ref Range   Sodium 141 135 - 145 mmol/L   Potassium 3.5 3.5 - 5.1 mmol/L   Chloride 109 98 - 111 mmol/L   CO2 23 22 - 32 mmol/L   Glucose, Bld 104 (H) 70 - 99 mg/dL   BUN 13 6 - 20 mg/dL   Creatinine, Ser 1.60 0.44 - 1.00 mg/dL   Calcium 8.8 (L) 8.9 - 10.3 mg/dL   GFR calc non Af Amer >60 >60 mL/min   GFR calc Af Amer >60 >60 mL/min   Anion gap 9 5 - 15  CBC  Result Value Ref Range   WBC 8.3 4.0 - 10.5 K/uL   RBC 4.59 3.87 - 5.11 MIL/uL   Hemoglobin 12.6 12.0 - 15.0 g/dL   HCT 73.7 36 - 46 %   MCV 78.9 (L) 80.0 - 100.0 fL   MCH 27.5 26.0 - 34.0 pg   MCHC 34.8 30.0 - 36.0 g/dL   RDW 10.6 26.9 - 48.5 %   Platelets 278 150 - 400 K/uL   nRBC 0.0 0.0 - 0.2 %  Urinalysis, Complete w Microscopic  Result Value Ref Range   Color, Urine YELLOW (A) YELLOW   APPearance HAZY (A) CLEAR   Specific Gravity, Urine 1.018 1.005 - 1.030   pH 5.0 5.0 - 8.0  Glucose, UA NEGATIVE NEGATIVE mg/dL   Hgb urine dipstick NEGATIVE NEGATIVE   Bilirubin Urine NEGATIVE NEGATIVE   Ketones, ur NEGATIVE  NEGATIVE mg/dL   Protein, ur NEGATIVE NEGATIVE mg/dL   Nitrite NEGATIVE NEGATIVE   Leukocytes,Ua NEGATIVE NEGATIVE   WBC, UA 0-5 0 - 5 WBC/hpf   Bacteria, UA MANY (A) NONE SEEN   Squamous Epithelial / LPF 0-5 0 - 5   Mucus PRESENT   POC urine preg, ED  Result Value Ref Range   Preg Test, Ur Negative Negative  POC SARS Coronavirus 2 Ag  Result Value Ref Range   SARS Coronavirus 2 Ag NEGATIVE NEGATIVE      Assessment & Plan:   Problem List Items Addressed This Visit      Other   Low back pain    Low back pain with cramp/spasm likely secondary to new workout routine and being deconditioned.  Discussed new workout routines and starting slow, working on good form, strengthening core muscles, good body mechanics.  Discussed use of ibuprofen, acetaminophen and cyclobenzaprine as directed, to help with current low back pain and cramp/spasm of left lower back.  Back exercises provided.  Plan: 1. Begin medication regimen as discussed (cyclobenzaprine 2.5-5mg  TID PRN, along with ibuprofen and/or acetaminophen) 2. Using heat/cold therapy as needed for symptoms 3. Working on back stretch/exercises to help reduce back pain 4. RTC PRN      Relevant Medications   cyclobenzaprine (FLEXERIL) 5 MG tablet   ibuprofen (ADVIL) 600 MG tablet   Cramp and spasm - Primary    See low back pain A/P      Relevant Medications   cyclobenzaprine (FLEXERIL) 5 MG tablet   ibuprofen (ADVIL) 600 MG tablet      Meds ordered this encounter  Medications  . cyclobenzaprine (FLEXERIL) 5 MG tablet    Sig: Take 0.5-1 tablets (2.5-5 mg total) by mouth 3 (three) times daily as needed for muscle spasms.    Dispense:  30 tablet    Refill:  1  . ibuprofen (ADVIL) 600 MG tablet    Sig: Take 1 tablet (600 mg total) by mouth every 6 (six) hours as needed.    Dispense:  30 tablet    Refill:  1      Follow up plan: Return if symptoms worsen or fail to improve.   Charlaine Dalton, FNP Family Nurse  Practitioner [facility] 09/29/2019, 11:39 AM

## 2019-10-07 ENCOUNTER — Other Ambulatory Visit: Payer: Self-pay

## 2019-10-07 MED ORDER — JUNEL FE 1/20 1-20 MG-MCG PO TABS: ORAL_TABLET | ORAL | 0 refills | Status: DC

## 2019-10-25 ENCOUNTER — Ambulatory Visit (INDEPENDENT_AMBULATORY_CARE_PROVIDER_SITE_OTHER): Payer: No Typology Code available for payment source | Admitting: Psychology

## 2019-10-25 DIAGNOSIS — F411 Generalized anxiety disorder: Secondary | ICD-10-CM | POA: Diagnosis not present

## 2019-11-01 ENCOUNTER — Other Ambulatory Visit: Payer: Self-pay | Admitting: Certified Nurse Midwife

## 2019-11-04 ENCOUNTER — Encounter: Payer: Self-pay | Admitting: Family Medicine

## 2019-11-23 ENCOUNTER — Other Ambulatory Visit: Payer: Self-pay

## 2019-11-23 MED ORDER — JUNEL FE 1/20 1-20 MG-MCG PO TABS: ORAL_TABLET | ORAL | 0 refills | Status: DC

## 2019-12-14 ENCOUNTER — Ambulatory Visit (INDEPENDENT_AMBULATORY_CARE_PROVIDER_SITE_OTHER): Payer: 59 | Admitting: Certified Nurse Midwife

## 2019-12-14 ENCOUNTER — Encounter: Payer: Self-pay | Admitting: Certified Nurse Midwife

## 2019-12-14 ENCOUNTER — Other Ambulatory Visit: Payer: Self-pay

## 2019-12-14 VITALS — BP 107/81 | HR 85 | Ht 65.0 in | Wt 166.6 lb

## 2019-12-14 DIAGNOSIS — R350 Frequency of micturition: Secondary | ICD-10-CM | POA: Diagnosis not present

## 2019-12-14 DIAGNOSIS — Z01419 Encounter for gynecological examination (general) (routine) without abnormal findings: Secondary | ICD-10-CM

## 2019-12-14 DIAGNOSIS — Z1159 Encounter for screening for other viral diseases: Secondary | ICD-10-CM

## 2019-12-14 DIAGNOSIS — Z114 Encounter for screening for human immunodeficiency virus [HIV]: Secondary | ICD-10-CM | POA: Diagnosis not present

## 2019-12-14 MED ORDER — JUNEL FE 1/20 1-20 MG-MCG PO TABS: ORAL_TABLET | ORAL | 12 refills | Status: DC

## 2019-12-14 NOTE — Progress Notes (Signed)
GYNECOLOGY ANNUAL PREVENTATIVE CARE ENCOUNTER NOTE  History:     Carol Conley is a 29 y.o. G0P0000 female here for a routine annual gynecologic exam.  Current complaints: urinary frequency and nocturia    Denies abnormal vaginal bleeding, discharge, pelvic pain, problems with intercourse or other gynecologic concerns.     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 Exercise: swim in door pool 1 x week /walking at work Smoke/Alcohol/drug DJS:HFWY alcohol use   Gynecologic History No LMP recorded (lmp unknown). (Menstrual status: Oral contraceptives). Contraception: OCP (estrogen/progesterone) Last Pap: 09/24/2018. Results were: normal with negative HPV Last mammogram: n/a   Obstetric History OB History  Gravida Para Term Preterm AB Living  0 0 0 0 0 0  SAB TAB Ectopic Multiple Live Births  0 0 0 0      Past Medical History:  Diagnosis Date  . Allergy   . Asthma    as child  . Colitis   . Esophageal dysphagia   . GERD (gastroesophageal reflux disease)   . Headache    sinus  . IBS (irritable bowel syndrome)   . IgA deficiency, selective (HCC)   . Motion sickness    cars, planes  . Sinusitis   . Stricture and stenosis of esophagus     Past Surgical History:  Procedure Laterality Date  . ESOPHAGEAL DILATION  04/08/2016   Procedure: ESOPHAGEAL DILATION;  Surgeon: [name], MD;  Location: [facility];  Service: Endoscopy;;  . ESOPHAGEAL DILATION  12/26/2016   Procedure: ESOPHAGEAL DILATION;  Surgeon: [name], MD;  Location: [facility];  Service: Endoscopy;;  . ESOPHAGOGASTRODUODENOSCOPY (EGD) WITH PROPOFOL N/A 04/08/2016   Procedure: ESOPHAGOGASTRODUODENOSCOPY (EGD) WITH PROPOFOL;  Surgeon: [name], MD;  Location: [facility];  Service: Endoscopy;  Laterality: N/A;  . ESOPHAGOGASTRODUODENOSCOPY (EGD) WITH PROPOFOL N/A 12/26/2016   Procedure: ESOPHAGOGASTRODUODENOSCOPY (EGD) WITH  PROPOFOL;  Surgeon: [name], MD;  Location: [facility];  Service: Endoscopy;  Laterality: N/A;  UPREG  . OVARIAN CYST REMOVAL Right    2007  . [facility]      Current Outpatient Medications on File Prior to Visit  Medication Sig Dispense Refill  . [name];[name] Inhale 2 puffs into the lungs every 6 (six) hours as needed for wheezing or shortness of breath. 1 Inhaler 0  . aspirin-acetaminophen-caffeine (EXCEDRIN MIGRAINE) 250-250-65 MG per tablet Take 2 tablets by mouth every 8 (eight) hours as needed for migraine.     Marland Kitchen azelastine (ASTELIN) 0.1 % nasal spray Place 2 sprays into both nostrils 2 (two) times daily.    Marland Kitchen [name] 60 MG capsule TAKE 1 CAPSULE BY MOUTH DAILY 60 capsule 6  . [name] TAKE 1 CAPSULE BY MOUTH 4 TIMES DAILY - BEFORE MEALS AND AT BEDTIME. 90 capsule 3  . hydrocortisone (ANUSOL-HC) 2.5 % rectal cream Place 1 application rectally 2 (two) times daily. 30 g 1  . ibuprofen (ADVIL) 600 MG tablet Take 1 tablet (600 mg total) by mouth every 6 (six) hours as needed. 30 tablet 1  . [name] 1-20 MG-MCG tablet Take 1 tablet daily 28 tablet 0  . levocetirizine (XYZAL) 5 MG tablet TAKE 1 TABLET BY MOUTH EVERY EVENING 30 tablet 1  . Lidocaine, [name], [name] Relief of anorectal pain and itching: Apply small, pea-sized amount of cream to affected area up to 6 times daily  as needed 30 g 1  . meclizine (ANTIVERT) 12.5 MG tablet Take 1 tablet (12.5 mg total) by mouth 3 (three) times daily as needed for dizziness. 30 tablet 0  . Melatonin 1 MG TABS Take 1-2 tablets (1-2 mg total) by mouth at bedtime.    . metoCLOPramide (REGLAN) 10 MG tablet     . [name] DISSOLVE 1 TABLET BY MOUTH EVERY 8 HOURS AS NEEDED FOR NAUSEA OR VOMITING 30 tablet 3  . Pediatric Multivit-Minerals-C (KIDS GUMMY BEAR VITAMINS PO) Take by mouth.    . promethazine  (PHENERGAN) 25 MG tablet TAKE 1 TABLET BY MOUTH EVERY 6 HOURS AS NEEDED FOR NAUSEA OR VOMITING. 30 tablet 6  . SUMAtriptan (IMITREX) 50 MG tablet Take 1 tablet at onset of headache May repeat every 2 hours x 3 total doses in 24 hours if headache persists or returns. (Patient not taking: Reported on 12/14/2019) 10 tablet 0   No current facility-administered medications on file prior to visit.    Allergies  Allergen Reactions  . Banana Itching    Itchy Throat  . [name] Hives  . Cantaloupe (Diagnostic) Itching    Itchy throat  . [name] Hives    Has taken other cephalosporins and augmentin without difficulty    Social History:  reports that she has never smoked. She has never used smokeless tobacco. She reports current alcohol use. She reports that she does not use drugs.  Family History  Problem Relation Age of Onset  . Cancer Father        skin  . Thyroid disease Mother     The following portions of the patient's history were reviewed and updated as appropriate: allergies, current medications, past family history, past medical history, past social history, past surgical history and problem list.  Review of Systems Pertinent items noted in HPI and remainder of comprehensive ROS otherwise negative.  Physical Exam:  BP 107/81   Pulse 85   Ht 5\' 5"  (1.651 m)   Wt 166 lb 9 oz (75.6 kg)   LMP  (LMP Unknown)   BMI 27.72 kg/m  CONSTITUTIONAL: Well-developed, well-nourished female in no acute distress.  HENT:  Normocephalic, atraumatic, External right and left ear normal. Oropharynx is clear and moist EYES: Conjunctivae and EOM are normal. Pupils are equal, round, and reactive to light. No scleral icterus.  NECK: Normal range of motion, supple, no masses.  Normal thyroid.  SKIN: Skin is warm and dry. No rash noted. Not diaphoretic. No erythema. No pallor. MUSCULOSKELETAL: Normal range of motion. No tenderness.  No cyanosis, clubbing, or edema.  2+ distal  pulses. NEUROLOGIC: Alert and oriented to person, place, and time. Normal reflexes, muscle tone coordination.  PSYCHIATRIC: Normal mood and affect. Normal behavior. Normal judgment and thought content. CARDIOVASCULAR: Normal heart rate noted, regular rhythm RESPIRATORY: Clear to auscultation bilaterally. Effort and breath sounds normal, no problems with respiration noted. BREASTS: Symmetric in size. No masses, tenderness, skin changes, nipple drainage, or lymphadenopathy bilaterally.  ABDOMEN: Soft, no distention noted.  No tenderness, rebound or guarding.  PELVIC:  pt declines exam, not due for pap, is not sexually active  Assessment and Plan:    1. Women's annual routine gynecological examination  Pap:not due Mammogram :n/a  Labs:will have done in future, getting new insurance Hep C/HIV ordered. U/a and culture  Refills:OCP Referral:none Routine preventative health maintenance measures emphasized. Please refer to After Visit Summary for other counseling recommendations.  Doreene Burke, CNM [facility],  [facility]

## 2019-12-14 NOTE — Patient Instructions (Signed)
Preventive Care 21-31 Years Old, Female Preventive care refers to visits with your health care provider and lifestyle choices that can promote health and wellness. This includes:  A yearly physical exam. This may also be called an annual well check.  Regular dental visits and eye exams.  Immunizations.  Screening for certain conditions.  Healthy lifestyle choices, such as eating a healthy diet, getting regular exercise, not using drugs or products that contain nicotine and tobacco, and limiting alcohol use. What can I expect for my preventive care visit? Physical exam Your health care provider will check your:  Height and weight. This may be used to calculate body mass index (BMI), which tells if you are at a healthy weight.  Heart rate and blood pressure.  Skin for abnormal spots. Counseling Your health care provider may ask you questions about your:  Alcohol, tobacco, and drug use.  Emotional well-being.  Home and relationship well-being.  Sexual activity.  Eating habits.  Work and work environment.  Method of birth control.  Menstrual cycle.  Pregnancy history. What immunizations do I need?  Influenza (flu) vaccine  This is recommended every year. Tetanus, diphtheria, and pertussis (Tdap) vaccine  You may need a Td booster every 10 years. Varicella (chickenpox) vaccine  You may need this if you have not been vaccinated. Human papillomavirus (HPV) vaccine  If recommended by your health care provider, you may need three doses over 6 months. Measles, mumps, and rubella (MMR) vaccine  You may need at least one dose of MMR. You may also need a second dose. Meningococcal conjugate (MenACWY) vaccine  One dose is recommended if you are age 19-21 years and a first-year college student living in a residence hall, or if you have one of several medical conditions. You may also need additional booster doses. Pneumococcal conjugate (PCV13) vaccine  You may need  this if you have certain conditions and were not previously vaccinated. Pneumococcal polysaccharide (PPSV23) vaccine  You may need one or two doses if you smoke cigarettes or if you have certain conditions. Hepatitis A vaccine  You may need this if you have certain conditions or if you travel or work in places where you may be exposed to hepatitis A. Hepatitis B vaccine  You may need this if you have certain conditions or if you travel or work in places where you may be exposed to hepatitis B. Haemophilus influenzae type b (Hib) vaccine  You may need this if you have certain conditions. You may receive vaccines as individual doses or as more than one vaccine together in one shot (combination vaccines). Talk with your health care provider about the risks and benefits of combination vaccines. What tests do I need?  Blood tests  Lipid and cholesterol levels. These may be checked every 5 years starting at age 20.  Hepatitis C test.  Hepatitis B test. Screening  Diabetes screening. This is done by checking your blood sugar (glucose) after you have not eaten for a while (fasting).  Sexually transmitted disease (STD) testing.  BRCA-related cancer screening. This may be done if you have a family history of breast, ovarian, tubal, or peritoneal cancers.  Pelvic exam and Pap test. This may be done every 3 years starting at age 21. Starting at age 30, this may be done every 5 years if you have a Pap test in combination with an HPV test. Talk with your health care provider about your test results, treatment options, and if necessary, the need for more tests.   Follow these instructions at home: Eating and drinking   Eat a diet that includes fresh fruits and vegetables, whole grains, lean protein, and low-fat dairy.  Take vitamin and mineral supplements as recommended by your health care provider.  Do not drink alcohol if: ? Your health care provider tells you not to drink. ? You are  pregnant, may be pregnant, or are planning to become pregnant.  If you drink alcohol: ? Limit how much you have to 0-1 drink a day. ? Be aware of how much alcohol is in your drink. In the [place] one drink equals one 12 oz bottle of beer (355 mL), one 5 oz glass of wine (148 mL), or one 1 oz glass of hard liquor (44 mL). Lifestyle  Take daily care of your teeth and gums.  Stay active. Exercise for at least 30 minutes on 5 or more days each week.  Do not use any products that contain nicotine or tobacco, such as cigarettes, e-cigarettes, and chewing tobacco. If you need help quitting, ask your health care provider.  If you are sexually active, practice safe sex. Use a condom or other form of birth control (contraception) in order to prevent pregnancy and STIs (sexually transmitted infections). If you plan to become pregnant, see your health care provider for a preconception visit. What's next?  Visit your health care provider once a year for a well check visit.  Ask your health care provider how often you should have your eyes and teeth checked.  Stay up to date on all vaccines. This information is not intended to replace advice given to you by your health care provider. Make sure you discuss any questions you have with your health care provider. Document Revised: 11/13/2017 Document Reviewed: 11/13/2017 Elsevier Patient Education  2020 [employer].

## 2019-12-15 LAB — URINALYSIS
Bilirubin, UA: NEGATIVE
Glucose, UA: NEGATIVE
Ketones, UA: NEGATIVE
Leukocytes,UA: NEGATIVE
Nitrite, UA: NEGATIVE
RBC, UA: NEGATIVE
Specific Gravity, UA: 1.02 (ref 1.005–1.030)
Urobilinogen, Ur: 0.2 mg/dL (ref 0.2–1.0)
pH, UA: 8.5 — ABNORMAL HIGH (ref 5.0–7.5)

## 2019-12-16 LAB — URINE CULTURE

## 2020-01-13 ENCOUNTER — Other Ambulatory Visit: Payer: Self-pay | Admitting: Family Medicine

## 2020-01-13 DIAGNOSIS — J0100 Acute maxillary sinusitis, unspecified: Secondary | ICD-10-CM

## 2020-01-13 DIAGNOSIS — J301 Allergic rhinitis due to pollen: Secondary | ICD-10-CM

## 2020-06-29 ENCOUNTER — Other Ambulatory Visit: Payer: Self-pay | Admitting: Family Medicine

## 2020-06-29 ENCOUNTER — Ambulatory Visit: Payer: 59 | Admitting: Family Medicine

## 2020-06-29 ENCOUNTER — Other Ambulatory Visit: Payer: Self-pay

## 2020-06-29 ENCOUNTER — Encounter: Payer: Self-pay | Admitting: Family Medicine

## 2020-06-29 VITALS — BP 107/78 | HR 97 | Temp 97.7°F | Ht 65.0 in | Wt 171.6 lb

## 2020-06-29 DIAGNOSIS — R350 Frequency of micturition: Secondary | ICD-10-CM | POA: Diagnosis not present

## 2020-06-29 DIAGNOSIS — K219 Gastro-esophageal reflux disease without esophagitis: Secondary | ICD-10-CM

## 2020-06-29 DIAGNOSIS — N3001 Acute cystitis with hematuria: Secondary | ICD-10-CM | POA: Diagnosis not present

## 2020-06-29 DIAGNOSIS — K222 Esophageal obstruction: Secondary | ICD-10-CM

## 2020-06-29 DIAGNOSIS — K30 Functional dyspepsia: Secondary | ICD-10-CM

## 2020-06-29 LAB — POCT URINALYSIS DIPSTICK
Bilirubin, UA: NEGATIVE
Glucose, UA: NEGATIVE
Ketones, UA: NEGATIVE
Nitrite, UA: NEGATIVE
Protein, UA: POSITIVE — AB
Spec Grav, UA: 1.025 (ref 1.010–1.025)
Urobilinogen, UA: 0.2 E.U./dL
pH, UA: 5 (ref 5.0–8.0)

## 2020-06-29 MED ORDER — CEPHALEXIN 500 MG PO CAPS: 500.0000 mg | ORAL_CAPSULE | Freq: Three times a day (TID) | ORAL | 0 refills | Status: DC

## 2020-06-29 MED ORDER — DEXLANSOPRAZOLE 60 MG PO CPDR: 1.0000 | DELAYED_RELEASE_CAPSULE | Freq: Every day | ORAL | 1 refills | Status: DC

## 2020-06-29 NOTE — Progress Notes (Signed)
Subjective:    Patient ID: Carol Conley, female    DOB: [birth year], 30 y.o.   MRN: [id number]  Carol Conley is a 30 y.o. female presenting on 06/29/2020 for Dysuria (Pt states it started about 2 weeks ago and it has worsen and bad odor.) and Urinary Frequency   HPI  Urinary Tract Infection / Urinary Frequency Onset 2 weeks, started. She just started back on night shift, usually she feels fine while working and may go urinate about 2 times in 12 hours during a shift. Otherwise, during day hours while trying to sleep she has had more urinary frequency around every 1 hour. - Admits urinary odor, darker urine - Admits some IBS symptoms as well, increased frequency diarrhea loose stools. - History of Cefzil in the past. Has done well with other cephalosporins and Augmentin. Denies fever chills nausea vomiting   Depression screen [facility] 2/9 12/08/2018 11/26/2017 10/24/2017  Decreased Interest 0 0 0  Down, Depressed, Hopeless 0 0 0  PHQ - 2 Score 0 0 0  Altered sleeping - 3 0  Tired, decreased energy - 3 3  Change in appetite - 0 0  Feeling bad or failure about yourself  - 0 0  Trouble concentrating - 1 0  Moving slowly or fidgety/restless - 0 0  Suicidal thoughts - 0 0  PHQ-9 Score - 7 3  Difficult doing work/chores - Very difficult Not difficult at all    Social History   Tobacco Use  . Smoking status: Never Smoker  . Smokeless tobacco: Never Used  Vaping Use  . Vaping Use: Never used  Substance Use Topics  . Alcohol use: Yes    Comment: occasionally   . [name]    Review of Systems Per HPI unless specifically indicated above     Objective:    BP 107/78 (BP Location: Left Arm, Patient Position: Sitting, Cuff Size: Normal)   Pulse 97   Temp 97.7 F (36.5 C) (Temporal)   Ht 5\' 5"  (1.651 m)   Wt 171 lb 9.6 oz (77.8 kg)   SpO2 99%   BMI 28.56 kg/m   Wt Readings from Last 3 Encounters:  06/29/20 171 lb 9.6 oz (77.8 kg)  12/14/19 166 lb 9 oz (75.6 kg)   09/29/19 163 lb 6.4 oz (74.1 kg)    Physical Exam Vitals and nursing note reviewed.  Constitutional:      General: She is not in acute distress.    Appearance: She is well-developed. She is not diaphoretic.     Comments: Well-appearing, comfortable, cooperative  HENT:     Head: Normocephalic and atraumatic.  Eyes:     General:        Right eye: No discharge.        Left eye: No discharge.     Conjunctiva/sclera: Conjunctivae normal.  Cardiovascular:     Rate and Rhythm: Normal rate.  Pulmonary:     Effort: Pulmonary effort is normal.  Skin:    General: Skin is warm and dry.     Findings: No erythema or rash.  Neurological:     Mental Status: She is alert and oriented to person, place, and time.  Psychiatric:        Behavior: Behavior normal.     Comments: Well groomed, good eye contact, normal speech and thoughts    Results for orders placed or performed in visit on 06/29/20  POCT Urinalysis Dipstick  Result Value Ref Range   Color,  UA Yellow    Clarity, UA Cloudy    Glucose, UA Negative Negative   Bilirubin, UA Negative    Ketones, UA Negative    Spec Grav, UA 1.025 1.010 - 1.025   Blood, UA Trace    pH, UA 5.0 5.0 - 8.0   Protein, UA Positive (A) Negative   Urobilinogen, UA 0.2 0.2 or 1.0 E.U./dL   Nitrite, UA Negative    Leukocytes, UA Trace (A) Negative   Appearance     Odor        Assessment & Plan:   Problem List Items Addressed This Visit   None   Visit Diagnoses    Acute cystitis with hematuria    -  Primary   Relevant Medications   cephALEXin (KEFLEX) 500 MG capsule   Urinary frequency       Relevant Orders   POCT Urinalysis Dipstick (Completed)   Urine Culture      Clinically consistent with UTI and confirmed on UA. No recent UTIs or abx courses.  No concern for pyelo today (no systemic symptoms, neg fever, back pain, n/v).  Plan: 1. UA dipstick abnormal 2. Ordered Urine culture 3. Keflex 500mg  TID x 7 days - note history of some  allergic reaction hives as child to previous cephalosporin has taken since then without problem, tolerates Augmentin. We agree to trial Keflex. 4. Improve PO hydration 5. RTC if no improvement 1-2 weeks, red flags given to return sooner    Meds ordered this encounter  Medications  . cephALEXin (KEFLEX) 500 MG capsule    Sig: Take 1 capsule (500 mg total) by mouth 3 (three) times daily. For 7 days    Dispense:  21 capsule    Refill:  0      Follow up plan: Return if symptoms worsen or fail to improve.   , DO [facility] 06/29/2020, 11:15 AM

## 2020-06-29 NOTE — Patient Instructions (Addendum)
Thank you for coming to the office today.  1. You have a Urinary Tract Infection - this is very common, your symptoms are reassuring and you should get better within 1 week on the antibiotics - Start Keflex 500mg 3 times daily for next 7 days, complete entire course, even if feeling better - We sent urine for a culture, we will call you within next few days if we need to change antibiotics - Please drink plenty of fluids, improve hydration over next 1 week  If symptoms worsening, developing nausea / vomiting, worsening back pain, fevers / chills / sweats, then please return for re-evaluation sooner.  If you take AZO OTC - limit this to 2-3 days MAX to avoid affecting kidneys  D-Mannose is a natural supplement that can actually help bind to urinary bacteria and reduce their effectiveness it can help prevent UTI from forming, and may reduce some symptoms. It likely cannot cure an active UTI but it is worth a try and good to prevent them with. Try 500mg twice a day at a full dose if you want, or check package instructions for more info   Please schedule a Follow-up Appointment to: Return if symptoms worsen or fail to improve.  If you have any other questions or concerns, please feel free to call the office or send a message through MyChart. You may also schedule an earlier appointment if necessary.  Additionally, you may be receiving a survey about your experience at our office within a few days to 1 week by e-mail or mail. We value your feedback.  Adonus Uselman, DO [facility], [facility] 

## 2020-06-30 LAB — URINE CULTURE
MICRO NUMBER:: 11772443
SPECIMEN QUALITY:: ADEQUATE

## 2020-07-03 ENCOUNTER — Other Ambulatory Visit: Payer: Self-pay | Admitting: Family Medicine

## 2020-07-03 DIAGNOSIS — K30 Functional dyspepsia: Secondary | ICD-10-CM

## 2020-07-03 DIAGNOSIS — K219 Gastro-esophageal reflux disease without esophagitis: Secondary | ICD-10-CM

## 2020-07-03 DIAGNOSIS — K222 Esophageal obstruction: Secondary | ICD-10-CM

## 2020-07-03 NOTE — Telephone Encounter (Signed)
Requested medication (s) are due for refill today:   Yes  Requested medication (s) are on the active medication list:   Yes  Future visit scheduled:   No   Seen 4 days ago by Dr. Althea Charon   Last ordered: 06/29/2020 #90, 1 refill  Returned because pharmacy requesting an alternative.   Requested Prescriptions  Pending Prescriptions Disp Refills   dexlansoprazole ([name]) 60 MG capsule [Pharmacy Med Name: DEXLANSOPRAZOLE DR 60 MG CAP] 90 capsule 1    Sig: TAKE 1 CAPSULE BY MOUTH EVERY DAY      Gastroenterology: Proton Pump Inhibitors Passed - 07/03/2020  1:04 PM      Passed - Valid encounter within last 12 months    Recent Outpatient Visits           4 days ago Acute cystitis with hematuria   [facility] [place], Netta Neat, DO   9 months ago Cramp and spasm   [facility], Jodelle Gross, FNP   10 months ago Dysfunction of both eustachian tubes   [facility], Jodelle Gross, FNP   1 year ago Anxiety   [facility], Alison Stalling, NP   2 years ago Chronic fatigue   [facility] [place], Alison Stalling, NP       Future Appointments             In 5 months Doreene Burke, CNM [facility]

## 2020-07-03 NOTE — Telephone Encounter (Signed)
Patient stated that the med was originally ordered by GI specialist.  I agreed to try to refill it.  She will probably need Prior Authorization.  She may have more information on which similar medications she has tried and failed before.  Patient previously followed w/ Danielle Rankin, FNP   Saralyn Pilar, DO [facility] 07/03/2020, 6:17 PM

## 2020-08-18 ENCOUNTER — Other Ambulatory Visit: Payer: Self-pay | Admitting: Certified Nurse Midwife

## 2020-09-08 ENCOUNTER — Other Ambulatory Visit: Payer: Self-pay | Admitting: Certified Nurse Midwife

## 2020-09-08 ENCOUNTER — Other Ambulatory Visit: Payer: Self-pay | Admitting: Gastroenterology

## 2020-09-08 DIAGNOSIS — K222 Esophageal obstruction: Secondary | ICD-10-CM

## 2020-09-08 DIAGNOSIS — K30 Functional dyspepsia: Secondary | ICD-10-CM

## 2020-09-08 DIAGNOSIS — K219 Gastro-esophageal reflux disease without esophagitis: Secondary | ICD-10-CM

## 2020-09-10 ENCOUNTER — Other Ambulatory Visit: Payer: Self-pay | Admitting: Certified Nurse Midwife

## 2020-10-07 ENCOUNTER — Other Ambulatory Visit: Payer: Self-pay | Admitting: Gastroenterology

## 2020-10-07 DIAGNOSIS — K219 Gastro-esophageal reflux disease without esophagitis: Secondary | ICD-10-CM

## 2020-10-07 DIAGNOSIS — K30 Functional dyspepsia: Secondary | ICD-10-CM

## 2020-10-07 DIAGNOSIS — K222 Esophageal obstruction: Secondary | ICD-10-CM

## 2020-11-11 ENCOUNTER — Other Ambulatory Visit: Payer: Self-pay | Admitting: Gastroenterology

## 2020-11-11 DIAGNOSIS — K30 Functional dyspepsia: Secondary | ICD-10-CM

## 2020-11-11 DIAGNOSIS — K222 Esophageal obstruction: Secondary | ICD-10-CM

## 2020-11-11 DIAGNOSIS — K219 Gastro-esophageal reflux disease without esophagitis: Secondary | ICD-10-CM

## 2020-12-18 ENCOUNTER — Encounter: Payer: 59 | Admitting: Certified Nurse Midwife

## 2021-01-22 ENCOUNTER — Other Ambulatory Visit: Payer: Self-pay

## 2021-01-22 MED ORDER — NORETHIN ACE-ETH ESTRAD-FE 1-20 MG-MCG PO TABS: ORAL_TABLET | ORAL | 0 refills | Status: DC

## 2021-02-13 ENCOUNTER — Encounter: Payer: Self-pay | Admitting: Certified Nurse Midwife

## 2021-02-13 ENCOUNTER — Ambulatory Visit (INDEPENDENT_AMBULATORY_CARE_PROVIDER_SITE_OTHER): Payer: 59 | Admitting: Certified Nurse Midwife

## 2021-02-13 ENCOUNTER — Other Ambulatory Visit: Payer: Self-pay

## 2021-02-13 VITALS — BP 117/83 | HR 120 | Ht 65.0 in | Wt 170.3 lb

## 2021-02-13 DIAGNOSIS — Z01419 Encounter for gynecological examination (general) (routine) without abnormal findings: Secondary | ICD-10-CM

## 2021-02-13 MED ORDER — NORETHIN ACE-ETH ESTRAD-FE 1-20 MG-MCG PO TABS: ORAL_TABLET | ORAL | 4 refills | Status: DC

## 2021-02-13 NOTE — Patient Instructions (Signed)

## 2021-02-13 NOTE — Progress Notes (Signed)
GYNECOLOGY ANNUAL PREVENTATIVE CARE ENCOUNTER NOTE  History:     Carol Conley is a 30 y.o. G0P0000 female here for a routine annual gynecologic exam.  Current complaints: none.   Denies abnormal vaginal bleeding, discharge, pelvic pain, problems with intercourse or other gynecologic concerns.     Social Relationship: single ( virgin ) Living: alone Work: travel Restaurant manager, fast food ICU Exercise: none Smoke/Alcohol/drug use: rare alcohol use   Gynecologic History No LMP recorded (lmp unknown). (Menstrual status: Oral contraceptives). Contraception: OCP Last Pap: 09/24/2018. Results were: normal with negative HPV Last mammogram: n/a .   Upstream - 02/13/21 1107       Pregnancy Intention Screening   Does the patient want to become pregnant in the next year? No    Does the patient's partner want to become pregnant in the next year? N/A    Would the patient like to discuss contraceptive options today? No      Contraception Wrap Up   Current Method Oral Contraceptive            The pregnancy intention screening data noted above was reviewed. Potential methods of contraception were discussed. The patient elected to proceed with OCP.   Obstetric History OB History  Gravida Para Term Preterm AB Living  0 0 0 0 0 0  SAB IAB Ectopic Multiple Live Births  0 0 0 0      Past Medical History:  Diagnosis Date   Allergy    Asthma    as child   Colitis    Esophageal dysphagia    GERD (gastroesophageal reflux disease)    Headache    sinus   IBS (irritable bowel syndrome)    IgA deficiency, selective (HCC)    Migraines    Motion sickness    cars, planes   Sinusitis    Stricture and stenosis of esophagus     Past Surgical History:  Procedure Laterality Date   ESOPHAGEAL DILATION  04/08/2016   Procedure: ESOPHAGEAL DILATION;  Surgeon: Lucilla Lame, MD;  Location: [place];  Service: Endoscopy;;   ESOPHAGEAL DILATION  12/26/2016   Procedure: ESOPHAGEAL DILATION;   Surgeon: Lucilla Lame, MD;  Location: [place];  Service: Endoscopy;;   ESOPHAGOGASTRODUODENOSCOPY (EGD) WITH PROPOFOL N/A 04/08/2016   Procedure: ESOPHAGOGASTRODUODENOSCOPY (EGD) WITH PROPOFOL;  Surgeon: Lucilla Lame, MD;  Location: [place];  Service: Endoscopy;  Laterality: N/A;   ESOPHAGOGASTRODUODENOSCOPY (EGD) WITH PROPOFOL N/A 12/26/2016   Procedure: ESOPHAGOGASTRODUODENOSCOPY (EGD) WITH PROPOFOL;  Surgeon: Lucilla Lame, MD;  Location: [place];  Service: Endoscopy;  Laterality: N/A;  UPREG   OVARIAN CYST REMOVAL Right    2007   wisdom theeth      Current Outpatient Medications on File Prior to Visit  Medication Sig Dispense Refill   [name];[name] Inhale 2 puffs into the lungs every 6 (six) hours as needed for wheezing or shortness of breath. 1 Inhaler 0   aspirin-acetaminophen-caffeine (EXCEDRIN MIGRAINE) 250-250-65 MG tablet Take 2 tablets by mouth every 8 (eight) hours as needed for migraine.      azelastine (ASTELIN) 0.1 % nasal spray Place 2 sprays into both nostrils 2 (two) times daily.     [name] TAKE 1 CAPSULE BY MOUTH 4 TIMES DAILY - BEFORE MEALS AND AT BEDTIME. 90 capsule 3   norethindrone-ethinyl estradiol-FE ([name]) 1-20 MG-MCG tablet TAKE 1 TABLET BY MOUTH EVERY DAY 84 tablet 0  [name] DISSOLVE 1 TABLET BY MOUTH EVERY 8 HOURS AS NEEDED FOR NAUSEA OR VOMITING 30 tablet 3   Pediatric Multivit-Minerals-C (KIDS GUMMY BEAR VITAMINS PO) Take by mouth.     cephALEXin (KEFLEX) 500 MG capsule Take 1 capsule (500 mg total) by mouth 3 (three) times daily. For 7 days (Patient not taking: Reported on 02/13/2021) 21 capsule 0   dexlansoprazole ([name]) 60 MG capsule TAKE 1 CAPSULE BY MOUTH EVERY DAY (Patient not taking: Reported on 02/13/2021) 30 capsule 0   hydrocortisone (ANUSOL-HC) 2.5 % rectal cream Place 1 application  rectally 2 (two) times daily. (Patient not taking: Reported on 02/13/2021) 30 g 1   ibuprofen (ADVIL) 600 MG tablet Take 1 tablet (600 mg total) by mouth every 6 (six) hours as needed. (Patient not taking: Reported on 02/13/2021) 30 tablet 1   levocetirizine (XYZAL) 5 MG tablet TAKE 1 TABLET EVERY DAY IN THE EVENING (Patient not taking: Reported on 02/13/2021) 30 tablet 1   Lidocaine, [name], [name] Relief of anorectal pain and itching: Apply small, pea-sized amount of cream to affected area up to 6 times daily as needed (Patient not taking: Reported on 02/13/2021) 30 g 1   meclizine (ANTIVERT) 12.5 MG tablet Take 1 tablet (12.5 mg total) by mouth 3 (three) times daily as needed for dizziness. (Patient not taking: Reported on 06/29/2020) 30 tablet 0   Melatonin 1 MG TABS Take 1-2 tablets (1-2 mg total) by mouth at bedtime. (Patient not taking: Reported on 02/13/2021)     metoCLOPramide (REGLAN) 10 MG tablet  (Patient not taking: Reported on 02/13/2021)     [name] TAKE 1 TABLET BY MOUTH EVERY 6 HOURS AS NEEDED FOR NAUSEA OR VOMITING. (Patient not taking: Reported on 02/13/2021) 30 tablet 6   SUMAtriptan (IMITREX) 50 MG tablet Take 1 tablet at onset of headache May repeat every 2 hours x 3 total doses in 24 hours if headache persists or returns. (Patient not taking: Reported on 12/14/2019) 10 tablet 0   No current facility-administered medications on file prior to visit.    Allergies  Allergen Reactions   Banana Itching    Itchy Throat   [name] Hives   Cantaloupe (Diagnostic) Itching    Itchy throat   [name] Hives    Has taken other cephalosporins and augmentin without difficulty    Social History:  reports that she has never smoked. She has never used smokeless tobacco. She reports current alcohol use. She reports that she does not use drugs.  Family History  Problem Relation Age of Onset   Thyroid disease Mother    Cancer  Father        skin   Breast cancer Paternal Grandmother     The following portions of the patient's history were reviewed and updated as appropriate: allergies, current medications, past family history, past medical history, past social history, past surgical history and problem list.  Review of Systems Pertinent items noted in HPI and remainder of comprehensive ROS otherwise negative.  Physical Exam:  BP 117/83   Pulse (!) 120   Ht 5\' 5"  (1.651 m)   Wt 170 lb 4.8 oz (77.2 kg)   LMP  (LMP Unknown)   BMI 28.34 kg/m  CONSTITUTIONAL: Well-developed, well-nourished female in no acute distress.  HENT:  Normocephalic, atraumatic, External right and left ear normal. Oropharynx is clear and moist EYES: Conjunctivae and EOM are normal. Pupils are equal, round, and reactive  to light. No scleral icterus.  NECK: Normal range of motion, supple, no masses.  Normal thyroid.  SKIN: Skin is warm and dry. No rash noted. Not diaphoretic. No erythema. No pallor. MUSCULOSKELETAL: Normal range of motion. No tenderness.  No cyanosis, clubbing, or edema.  2+ distal pulses. NEUROLOGIC: Alert and oriented to person, place, and time. Normal reflexes, muscle tone coordination.  PSYCHIATRIC: Normal mood and affect. Normal behavior. Normal judgment and thought content. CARDIOVASCULAR: Normal heart rate noted, regular rhythm RESPIRATORY: Clear to auscultation bilaterally. Effort and breath sounds normal, no problems with respiration noted. BREASTS: Symmetric in size. No masses, tenderness, skin changes, nipple drainage, or lymphadenopathy bilaterally.  ABDOMEN: Soft, no distention noted.  No tenderness, rebound or guarding.  PELVIC: Declines exam, not sexually active not due for pap .   Assessment and Plan:    Annual Well Women GYN Exam Pap: n/a  Mammogram : n/a  Labs: declines Refills: OCP Referral: none  Routine preventative health maintenance measures emphasized. Please refer to After Visit Summary for  other counseling recommendations.      Philip Aspen, CNM [facility]

## 2022-02-04 ENCOUNTER — Encounter: Payer: Self-pay | Admitting: Certified Nurse Midwife

## 2022-02-04 MED ORDER — NORETHIN ACE-ETH ESTRAD-FE 1-20 MG-MCG PO TABS: ORAL_TABLET | ORAL | 0 refills | Status: DC

## 2022-02-25 ENCOUNTER — Telehealth: Payer: Self-pay | Admitting: Certified Nurse Midwife

## 2022-02-25 NOTE — Telephone Encounter (Signed)
Patient is scheduled for 04/19/22 with AT

## 2022-02-25 NOTE — Telephone Encounter (Signed)
I contacted patient via phone, left voicemail for patient to call back to be rescheduled from 12/12 with AT.  

## 2022-02-26 ENCOUNTER — Encounter: Payer: Self-pay | Admitting: Certified Nurse Midwife

## 2022-04-10 ENCOUNTER — Other Ambulatory Visit: Payer: Self-pay

## 2022-04-10 ENCOUNTER — Encounter: Payer: Self-pay | Admitting: Certified Nurse Midwife

## 2022-04-10 MED ORDER — NORETHIN ACE-ETH ESTRAD-FE 1-20 MG-MCG PO TABS: ORAL_TABLET | ORAL | 0 refills | Status: DC

## 2022-04-19 ENCOUNTER — Ambulatory Visit (INDEPENDENT_AMBULATORY_CARE_PROVIDER_SITE_OTHER): Payer: Self-pay | Admitting: Certified Nurse Midwife

## 2022-04-19 ENCOUNTER — Encounter: Payer: Self-pay | Admitting: Certified Nurse Midwife

## 2022-04-19 VITALS — BP 120/79 | HR 98 | Resp 16 | Ht 65.0 in | Wt 177.7 lb

## 2022-04-19 DIAGNOSIS — Z01419 Encounter for gynecological examination (general) (routine) without abnormal findings: Secondary | ICD-10-CM

## 2022-04-19 MED ORDER — NORETHIN ACE-ETH ESTRAD-FE 1-20 MG-MCG PO TABS: ORAL_TABLET | ORAL | 3 refills | Status: DC

## 2022-04-19 NOTE — Progress Notes (Signed)
GYNECOLOGY ANNUAL PREVENTATIVE CARE ENCOUNTER NOTE  History:     Carol Conley is a 31 y.o. G0P0000 female here for a routine annual gynecologic exam.  Current complaints: none.   Denies abnormal vaginal bleeding, discharge, pelvic pain, problems with intercourse or other gynecologic concerns.     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  Gynecologic History No LMP recorded (within months). (Menstrual status: Oral contraceptives). Contraception: OCP (estrogen/progesterone) Last Pap: 09/24/18. Results were: normal with negative HPV Last mammogram: n/a .    Upstream - 04/19/22 1331       Pregnancy Intention Screening   Does the patient want to become pregnant in the next year? No    Does the patient's partner want to become pregnant in the next year? No    Would the patient like to discuss contraceptive options today? No      Contraception Wrap Up   Current Method Oral Contraceptive    End Method Oral Contraceptive            The pregnancy intention screening data noted above was reviewed. Potential methods of contraception were discussed. The patient elected to proceed with ocp.   Obstetric History OB History  Gravida Para Term Preterm AB Living  0 0 0 0 0 0  SAB IAB Ectopic Multiple Live Births  0 0 0 0      Past Medical History:  Diagnosis Date   Allergy    Asthma    as child   Colitis    Esophageal dysphagia    GERD (gastroesophageal reflux disease)    Headache    sinus   IBS (irritable bowel syndrome)    IgA deficiency, selective (HCC)    Migraines    Motion sickness    cars, planes   Sinusitis    Stricture and stenosis of esophagus     Past Surgical History:  Procedure Laterality Date   ESOPHAGEAL DILATION  04/08/2016   Procedure: ESOPHAGEAL DILATION;  Surgeon: Lucilla Lame, MD;  Location: [place];  Service: Endoscopy;;   ESOPHAGEAL DILATION  12/26/2016   Procedure: ESOPHAGEAL DILATION;  Surgeon: Lucilla Lame, MD;  Location: [place];  Service: Endoscopy;;   ESOPHAGOGASTRODUODENOSCOPY (EGD) WITH PROPOFOL N/A 04/08/2016   Procedure: ESOPHAGOGASTRODUODENOSCOPY (EGD) WITH PROPOFOL;  Surgeon: Lucilla Lame, MD;  Location: [place];  Service: Endoscopy;  Laterality: N/A;   ESOPHAGOGASTRODUODENOSCOPY (EGD) WITH PROPOFOL N/A 12/26/2016   Procedure: ESOPHAGOGASTRODUODENOSCOPY (EGD) WITH PROPOFOL;  Surgeon: Lucilla Lame, MD;  Location: [place];  Service: Endoscopy;  Laterality: N/A;  UPREG   OVARIAN CYST REMOVAL Right    2007   wisdom theeth      Current Outpatient Medications on File Prior to Visit  Medication Sig Dispense Refill   esomeprazole (NEXIUM) 20 MG packet Take 20 mg by mouth daily before breakfast.     [name] Take 180 mg by mouth daily.     MELATONIN PO Take by mouth.     Multiple Vitamins-Iron (MULTIVITAMIN/IRON PO) Take by mouth.     norethindrone-ethinyl estradiol-FE ([name]) 1-20 MG-MCG tablet TAKE 1 TABLET BY MOUTH EVERY DAY 84 tablet 0   [name];[name] Inhale 2 puffs into the lungs every 6 (six) hours as needed for wheezing or shortness of breath. 1 Inhaler 0   aspirin-acetaminophen-caffeine (EXCEDRIN  MIGRAINE) 250-250-65 MG tablet Take 2 tablets by mouth every 8 (eight) hours as needed for migraine.      azelastine (ASTELIN) 0.1 % nasal spray Place 2 sprays into both nostrils 2 (two) times daily. (Patient not taking: Reported on 04/19/2022)     [name] TAKE 1 CAPSULE BY MOUTH 4 TIMES DAILY - BEFORE MEALS AND AT BEDTIME. 90 capsule 3   [name] DISSOLVE 1 TABLET BY MOUTH EVERY 8 HOURS AS NEEDED FOR NAUSEA OR VOMITING 30 tablet 3   Pediatric Multivit-Minerals-C (KIDS GUMMY BEAR VITAMINS  PO) Take by mouth.     No current facility-administered medications on file prior to visit.    Allergies  Allergen Reactions   Clarithromycin Hives and Rash   Cantaloupe (Diagnostic) Itching    Itchy throat   Banana Itching    Itchy Throat   Cefprozil Hives and Rash    Has taken other cephalosporins and augmentin without difficulty    Social History:  reports that she has never smoked. She has never used smokeless tobacco. She reports current alcohol use. She reports that she does not use drugs.  Family History  Problem Relation Age of Onset   Thyroid disease Mother    Cancer Father        skin   Breast cancer Paternal Grandmother     The following portions of the patient's history were reviewed and updated as appropriate: allergies, current medications, past family history, past medical history, past social history, past surgical history and problem list.  Review of Systems Pertinent items noted in HPI and remainder of comprehensive ROS otherwise negative.  Physical Exam:  BP 120/79   Pulse 98   Resp 16   Ht 5\' 5"  (1.651 m)   Wt 177 lb 11.2 oz (80.6 kg)   LMP  (Within Months)   BMI 29.57 kg/m  CONSTITUTIONAL: Well-developed, well-nourished female in no acute distress.  HENT:  Normocephalic, atraumatic, External right and left ear normal. Oropharynx is clear and moist EYES: Conjunctivae and EOM are normal. Pupils are equal, round, and reactive to light. No scleral icterus.  NECK: Normal range of motion, supple, no masses.  Normal thyroid.  SKIN: Skin is warm and dry. No rash noted. Not diaphoretic. No erythema. No pallor. MUSCULOSKELETAL: Normal range of motion. No tenderness.  No cyanosis, clubbing, or edema.  2+ distal pulses. NEUROLOGIC: Alert and oriented to person, place, and time. Normal reflexes, muscle tone coordination.  PSYCHIATRIC: Normal mood and affect. Normal behavior. Normal judgment and thought content. CARDIOVASCULAR: Normal heart rate noted, regular  rhythm RESPIRATORY: Clear to auscultation bilaterally. Effort and breath sounds normal, no problems with respiration noted. BREASTS: Symmetric in size. No masses, tenderness, skin changes, nipple drainage, or lymphadenopathy bilaterally.  ABDOMEN: Soft, no distention noted.  No tenderness, rebound or guarding.  PELVIC: pt declined exam today, she has never been sexually active and is not due for pap smears. She denies any present concerns for discharge, odor, pain etc.   Assessment and Plan:    1. Women's annual routine gynecological examination  Pap: due 2025 Mammogram : n/a  Labs: none Refills: ocp  Referral: none Discussed risk OCP with age, she verbalizes understanding and desires to continue with OCP.  Routine preventative health maintenance measures emphasized. Please refer to After Visit Summary for other counseling recommendations.      Philip Aspen, [facility]  [facility]

## 2022-04-19 NOTE — Patient Instructions (Signed)

## 2023-02-17 ENCOUNTER — Other Ambulatory Visit: Payer: Self-pay | Admitting: Certified Nurse Midwife

## 2023-02-18 ENCOUNTER — Other Ambulatory Visit: Payer: Self-pay | Admitting: Certified Nurse Midwife

## 2023-02-18 ENCOUNTER — Encounter: Payer: Self-pay | Admitting: Certified Nurse Midwife

## 2023-02-18 MED ORDER — NORETHIN ACE-ETH ESTRAD-FE 1-20 MG-MCG PO TABS: ORAL_TABLET | ORAL | 0 refills | Status: DC

## 2023-05-02 ENCOUNTER — Other Ambulatory Visit: Payer: Self-pay | Admitting: Certified Nurse Midwife

## 2023-05-02 MED ORDER — NORETHIN ACE-ETH ESTRAD-FE 1-20 MG-MCG PO TABS: ORAL_TABLET | ORAL | 0 refills | Status: AC

## 2023-06-12 ENCOUNTER — Ambulatory Visit: Payer: Self-pay | Admitting: Certified Nurse Midwife

## 2023-08-03 ENCOUNTER — Other Ambulatory Visit: Payer: Self-pay | Admitting: Certified Nurse Midwife
